# Patient Record
Sex: Female | Born: 1975 | Race: White | Hispanic: No | Marital: Married | Smoking: Never smoker
Health system: Southern US, Community
[De-identification: ages and names within clinical notes are randomized; demographics above are authoritative.]

## PROBLEM LIST (undated history)

## (undated) DIAGNOSIS — M25579 Pain in unspecified ankle and joints of unspecified foot: Secondary | ICD-10-CM

## (undated) DIAGNOSIS — I10 Essential (primary) hypertension: Secondary | ICD-10-CM

## (undated) DIAGNOSIS — F419 Anxiety disorder, unspecified: Secondary | ICD-10-CM

## (undated) HISTORY — PX: TONSILLECTOMY: SUR1361

## (undated) HISTORY — PX: ABLATION: SHX5711

---

## 2014-07-25 HISTORY — PX: ACHILLES TENDON REPAIR: SUR1153

## 2015-07-26 HISTORY — PX: ACHILLES TENDON REPAIR: SUR1153

## 2017-02-01 DIAGNOSIS — M7072 Other bursitis of hip, left hip: Secondary | ICD-10-CM | POA: Insufficient documentation

## 2018-03-30 ENCOUNTER — Ambulatory Visit (INDEPENDENT_AMBULATORY_CARE_PROVIDER_SITE_OTHER): Payer: Managed Care, Other (non HMO) | Admitting: Family Medicine

## 2018-03-30 ENCOUNTER — Encounter: Payer: Self-pay | Admitting: Family Medicine

## 2018-03-30 VITALS — BP 120/88 | HR 97 | Ht 66.75 in | Wt 237.0 lb

## 2018-03-30 DIAGNOSIS — M25571 Pain in right ankle and joints of right foot: Secondary | ICD-10-CM | POA: Diagnosis not present

## 2018-03-30 DIAGNOSIS — E669 Obesity, unspecified: Secondary | ICD-10-CM

## 2018-03-30 DIAGNOSIS — G8929 Other chronic pain: Secondary | ICD-10-CM

## 2018-03-30 DIAGNOSIS — Z800 Family history of malignant neoplasm of digestive organs: Secondary | ICD-10-CM | POA: Diagnosis not present

## 2018-03-30 DIAGNOSIS — Z7689 Persons encountering health services in other specified circumstances: Secondary | ICD-10-CM

## 2018-03-30 MED ORDER — TRAMADOL HCL 50 MG PO TABS: 100.0000 mg | ORAL_TABLET | Freq: Four times a day (QID) | ORAL | 0 refills | Status: DC | PRN

## 2018-03-30 MED ORDER — GABAPENTIN 300 MG PO CAPS: 300.0000 mg | ORAL_CAPSULE | Freq: Three times a day (TID) | ORAL | 1 refills | Status: DC

## 2018-03-30 NOTE — Progress Notes (Signed)
BP 120/88   Pulse 97   Ht 5' 6.75" (1.695 m)   Wt 237 lb (107.5 kg)   SpO2 98%   BMI 37.40 kg/m    Subjective:    Patient ID: Darlene [name], female    DOB: 03/16/1976, 42 y.o.   MRN: [id number]  HPI: Darlene [name] is a 42 y.o. female  Chief Complaint  Patient presents with  . New Patient (Initial Visit)    Patient would like Colonoscopy due to family history.    Patient here today to establish care.   Father had colon cancer diagnosed at 8 years old. Previous doctor told her she needed screening early. Denies any constipation, stool caliber changes, rectal bleeding, hx of anemia, weight loss, night sweats.   Tore achilles right foot several years ago, has had 2 surgeries with it and still in a lot of pain. Has had two orthopedists tell her there is nothing they can do from here. Has done PT, ice, elevation, rest, cortisone injections, OTC medications, percocet - nothing has really helped. Tramadol seems to work the best for her. Tried cymbalta which didn't help. Muscle relaxers make her hyper and unable to sleep. She only takes it on days where she's active (hiking, tennis, etc) just recently has been able to start being active again.   Last CPE was in November, last labs in March.   Relevant past medical, surgical, family and social history reviewed and updated as indicated. Interim medical history since our last visit reviewed. Allergies and medications reviewed and updated.  Review of Systems  Per HPI unless specifically indicated above     Objective:    BP 120/88   Pulse 97   Ht 5' 6.75" (1.695 m)   Wt 237 lb (107.5 kg)   SpO2 98%   BMI 37.40 kg/m   Wt Readings from Last 3 Encounters:  03/30/18 237 lb (107.5 kg)    Physical Exam  Constitutional: She is oriented to person, place, and time. She appears well-developed and well-nourished. No distress.  HENT:  Head: Atraumatic.  Eyes: Conjunctivae and EOM are normal.  Neck: Neck supple.  Cardiovascular:  Normal rate, regular rhythm and normal heart sounds.  Pulmonary/Chest: Effort normal and breath sounds normal.  Musculoskeletal: Normal range of motion. She exhibits edema (minimal edema posterior right ankle) and tenderness (right achilles).  Neurological: She is alert and oriented to person, place, and time.  Skin: Skin is warm and dry.  Psychiatric: She has a normal mood and affect. Her behavior is normal.  Nursing note and vitals reviewed.   No results found for this or any previous visit.    Assessment & Plan:   Problem List Items Addressed This Visit      Other   Chronic pain of right ankle - Primary    Long discussion today about management. Currently taking 1-2 tramadol weekly. Has tried many other interventions without success. Will start gabapentin and titrate up, monitoring closely for benefit. Will supply 40 tramadol. Pt aware that this should last at least 6 months, and if it is not that we will discuss going to the pain clinic at that point. She is agreeable to this plan. Urine drug screen today. Recheck in 2 months      Relevant Orders   Urine drugs of abuse scrn w alc, routine (Ref Lab)   Obesity (BMI 35.0-39.9 without comorbidity)    Work on increasing exercise as tolerated and improving diet       Other  Visit Diagnoses    Encounter to establish care       Family history of colon cancer       Discussed that typically colonoscopies are performed 10 years prior to family members' dx, so she can just start on a regular schedule at 48 unless sxs begin       Follow up plan: Return in about 2 years (around 03/30/2020) for CPE.

## 2018-03-30 NOTE — Assessment & Plan Note (Addendum)
Long discussion today about management. Currently taking 1-2 tramadol weekly. Has tried many other interventions without success. Will start gabapentin and titrate up, monitoring closely for benefit. Will supply 40 tramadol. Pt aware that this should last at least 6 months, and if it is not that we will discuss going to the pain clinic at that point. She is agreeable to this plan. Urine drug screen today. Recheck in 2 months

## 2018-03-30 NOTE — Assessment & Plan Note (Signed)
Work on increasing exercise as tolerated and improving diet

## 2018-03-30 NOTE — Patient Instructions (Signed)
Follow up for CPE 

## 2018-03-31 LAB — URINE DRUGS OF ABUSE SCREEN W ALC, ROUTINE (REF LAB)
Amphetamines, Urine: NEGATIVE ng/mL
BENZODIAZEPINE QUANT UR: NEGATIVE ng/mL
Barbiturate Quant, Ur: NEGATIVE ng/mL
CANNABINOID QUANT UR: NEGATIVE ng/mL
COCAINE (METAB.): NEGATIVE ng/mL
Ethanol, Urine: NEGATIVE %
METHADONE SCREEN, URINE: NEGATIVE ng/mL
OPIATE QUANT UR: NEGATIVE ng/mL
PCP QUANT UR: NEGATIVE ng/mL
Propoxyphene: NEGATIVE ng/mL

## 2018-05-31 ENCOUNTER — Ambulatory Visit (INDEPENDENT_AMBULATORY_CARE_PROVIDER_SITE_OTHER): Payer: Managed Care, Other (non HMO) | Admitting: Family Medicine

## 2018-05-31 VITALS — BP 127/85 | HR 82 | Temp 98.4°F | Ht 68.5 in | Wt 235.0 lb

## 2018-05-31 DIAGNOSIS — Z114 Encounter for screening for human immunodeficiency virus [HIV]: Secondary | ICD-10-CM

## 2018-05-31 DIAGNOSIS — Z23 Encounter for immunization: Secondary | ICD-10-CM

## 2018-05-31 DIAGNOSIS — E669 Obesity, unspecified: Secondary | ICD-10-CM | POA: Diagnosis not present

## 2018-05-31 DIAGNOSIS — G8929 Other chronic pain: Secondary | ICD-10-CM | POA: Diagnosis not present

## 2018-05-31 DIAGNOSIS — Z0000 Encounter for general adult medical examination without abnormal findings: Secondary | ICD-10-CM | POA: Diagnosis not present

## 2018-05-31 DIAGNOSIS — M25571 Pain in right ankle and joints of right foot: Secondary | ICD-10-CM

## 2018-05-31 MED ORDER — GABAPENTIN 300 MG PO CAPS: 300.0000 mg | ORAL_CAPSULE | Freq: Three times a day (TID) | ORAL | 1 refills | Status: DC

## 2018-05-31 NOTE — Patient Instructions (Signed)
Tdap Vaccine (Tetanus, Diphtheria and Pertussis): What You Need to Know 1. Why get vaccinated? Tetanus, diphtheria and pertussis are very serious diseases. Tdap vaccine can protect us from these diseases. And, Tdap vaccine given to pregnant women can protect newborn babies against pertussis. TETANUS (Lockjaw) is rare in the [place] today. It causes painful muscle tightening and stiffness, usually all over the body.  It can lead to tightening of muscles in the head and neck so you can't open your mouth, swallow, or sometimes even breathe. Tetanus kills about 1 out of 10 people who are infected even after receiving the best medical care.  DIPHTHERIA is also rare in the [place] today. It can cause a thick coating to form in the back of the throat.  It can lead to breathing problems, heart failure, paralysis, and death.  PERTUSSIS (Whooping Cough) causes severe coughing spells, which can cause difficulty breathing, vomiting and disturbed sleep.  It can also lead to weight loss, incontinence, and rib fractures. Up to 2 in 100 adolescents and 5 in 100 adults with pertussis are hospitalized or have complications, which could include pneumonia or death.  These diseases are caused by bacteria. Diphtheria and pertussis are spread from person to person through secretions from coughing or sneezing. Tetanus enters the body through cuts, scratches, or wounds. Before vaccines, as many as 200,000 cases of diphtheria, 200,000 cases of pertussis, and hundreds of cases of tetanus, were reported in the [place] each year. Since vaccination began, reports of cases for tetanus and diphtheria have dropped by about 99% and for pertussis by about 80%. 2. Tdap vaccine Tdap vaccine can protect adolescents and adults from tetanus, diphtheria, and pertussis. One dose of Tdap is routinely given at age 11 or 12. People who did not get Tdap at that age should get it as soon as possible. Tdap is especially  important for healthcare professionals and anyone having close contact with a baby younger than 12 months. Pregnant women should get a dose of Tdap during every pregnancy, to protect the newborn from pertussis. Infants are most at risk for severe, life-threatening complications from pertussis. Another vaccine, called Td, protects against tetanus and diphtheria, but not pertussis. A Td booster should be given every 10 years. Tdap may be given as one of these boosters if you have never gotten Tdap before. Tdap may also be given after a severe cut or burn to prevent tetanus infection. Your doctor or the person giving you the vaccine can give you more information. Tdap may safely be given at the same time as other vaccines. 3. Some people should not get this vaccine  A person who has ever had a life-threatening allergic reaction after a previous dose of any diphtheria, tetanus or pertussis containing vaccine, OR has a severe allergy to any part of this vaccine, should not get Tdap vaccine. Tell the person giving the vaccine about any severe allergies.  Anyone who had coma or long repeated seizures within 7 days after a childhood dose of DTP or DTaP, or a previous dose of Tdap, should not get Tdap, unless a cause other than the vaccine was found. They can still get Td.  Talk to your doctor if you: ? have seizures or another nervous system problem, ? had severe pain or swelling after any vaccine containing diphtheria, tetanus or pertussis, ? ever had a condition called Guillain-Barr Syndrome (GBS), ? aren't feeling well on the day the shot is scheduled. 4. Risks With any medicine, including   vaccines, there is a chance of side effects. These are usually mild and go away on their own. Serious reactions are also possible but are rare. Most people who get Tdap vaccine do not have any problems with it. Mild problems following Tdap: (Did not interfere with activities)  Pain where the shot was given (about  3 in 4 adolescents or 2 in 3 adults)  Redness or swelling where the shot was given (about 1 person in 5)  Mild fever of at least 100.4F (up to about 1 in 25 adolescents or 1 in 100 adults)  Headache (about 3 or 4 people in 10)  Tiredness (about 1 person in 3 or 4)  Nausea, vomiting, diarrhea, stomach ache (up to 1 in 4 adolescents or 1 in 10 adults)  Chills, sore joints (about 1 person in 10)  Body aches (about 1 person in 3 or 4)  Rash, swollen glands (uncommon)  Moderate problems following Tdap: (Interfered with activities, but did not require medical attention)  Pain where the shot was given (up to 1 in 5 or 6)  Redness or swelling where the shot was given (up to about 1 in 16 adolescents or 1 in 12 adults)  Fever over 102F (about 1 in 100 adolescents or 1 in 250 adults)  Headache (about 1 in 7 adolescents or 1 in 10 adults)  Nausea, vomiting, diarrhea, stomach ache (up to 1 or 3 people in 100)  Swelling of the entire arm where the shot was given (up to about 1 in 500).  Severe problems following Tdap: (Unable to perform usual activities; required medical attention)  Swelling, severe pain, bleeding and redness in the arm where the shot was given (rare).  Problems that could happen after any vaccine:  People sometimes faint after a medical procedure, including vaccination. Sitting or lying down for about 15 minutes can help prevent fainting, and injuries caused by a fall. Tell your doctor if you feel dizzy, or have vision changes or ringing in the ears.  Some people get severe pain in the shoulder and have difficulty moving the arm where a shot was given. This happens very rarely.  Any medication can cause a severe allergic reaction. Such reactions from a vaccine are very rare, estimated at fewer than 1 in a million doses, and would happen within a few minutes to a few hours after the vaccination. As with any medicine, there is a very remote chance of a vaccine  causing a serious injury or death. The safety of vaccines is always being monitored. For more information, visit: www.cdc.gov/vaccinesafety/ 5. What if there is a serious problem? What should I look for? Look for anything that concerns you, such as signs of a severe allergic reaction, very high fever, or unusual behavior. Signs of a severe allergic reaction can include hives, swelling of the face and throat, difficulty breathing, a fast heartbeat, dizziness, and weakness. These would usually start a few minutes to a few hours after the vaccination. What should I do?  If you think it is a severe allergic reaction or other emergency that can't wait, call 9-1-1 or get the person to the nearest hospital. Otherwise, call your doctor.  Afterward, the reaction should be reported to the Vaccine Adverse Event Reporting System (VAERS). Your doctor might file this report, or you can do it yourself through the VAERS web site at www.vaers.hhs.gov, or by calling 1-800-822-7967. ? VAERS does not give medical advice. 6. The National Vaccine Injury Compensation Program The National   Vaccine Injury Compensation Program (VICP) is a federal program that was created to compensate people who may have been injured by certain vaccines. Persons who believe they may have been injured by a vaccine can learn about the program and about filing a claim by calling 1-800-338-2382 or visiting the VICP website at www.hrsa.gov/vaccinecompensation. There is a time limit to file a claim for compensation. 7. How can I learn more?  Ask your doctor. He or she can give you the vaccine package insert or suggest other sources of information.  Call your local or state health department.  Contact the Centers for Disease Control and Prevention (CDC): ? Call 1-800-232-4636 (1-800-CDC-INFO) or ? Visit CDC's website at www.cdc.gov/vaccines CDC Tdap Vaccine VIS (09/17/13) This information is not intended to replace advice given to you by your  health care provider. Make sure you discuss any questions you have with your health care provider. Document Released: 01/10/2012 Document Revised: 03/31/2016 Document Reviewed: 03/31/2016 Elsevier Interactive Patient Education  2017 Elsevier Inc.  

## 2018-05-31 NOTE — Progress Notes (Signed)
BP 127/85   Pulse 82   Temp 98.4 F (36.9 C) (Oral)   Ht 5' 8.5" (1.74 m)   Wt 235 lb (106.6 kg)   SpO2 98%   BMI 35.21 kg/m    Subjective:    Patient ID: Darlene [name], female    DOB: October 27, 1975, 42 y.o.   MRN: [id number]  HPI: Darlene [name] is a 42 y.o. female presenting on 05/31/2018 for comprehensive medical examination. Current medical complaints include:see below  Has taken 2 tramadol since it was prescribed 2 months ago for her right ankle pain. Taking 2 gabapentin at bedtime prn which seems to help with sleep and maybe some with the pain.   Last pap she believes was around 1 year ago.   Last mammogram around age 68, normal result - pt unsure why she was made to do one at that time. Wanting to start her normal screening mammograms next year at age 44.   She currently lives with: Menopausal Symptoms: no  Depression Screen done today and results listed below:  Depression screen [facility] 2/9 05/31/2018 03/30/2018  Decreased Interest 0 0  Down, Depressed, Hopeless 0 0  PHQ - 2 Score 0 0  Altered sleeping 1 1  Tired, decreased energy 1 -  Change in appetite 0 0  Feeling bad or failure about yourself  0 0  Trouble concentrating 0 0  Moving slowly or fidgety/restless 0 0  Suicidal thoughts 0 0  PHQ-9 Score 2 1    The patient does not have a history of falls. I did not complete a risk assessment for falls. A plan of care for falls was not documented.   Past Medical History:  No past medical history on file.  Surgical History:  Past Surgical History:  Procedure Laterality Date  . ACHILLES TENDON REPAIR Right 2016  . ACHILLES TENDON REPAIR Right 2017    Medications:  Current Outpatient Medications on File Prior to Visit  Medication Sig  . etonogestrel (NEXPLANON) 68 MG IMPL implant 1 each by Subdermal route once.  . traMADol (ULTRAM) 50 MG tablet Take 2 tablets (100 mg total) by mouth every 6 (six) hours as needed.   No current facility-administered  medications on file prior to visit.     Allergies:  Allergies  Allergen Reactions  . Other     Social History:  Social History   Socioeconomic History  . Marital status: Married    Spouse name: Not on file  . Number of children: Not on file  . Years of education: Not on file  . Highest education level: Not on file  Occupational History  . Not on file  Social Needs  . Financial resource strain: Not on file  . Food insecurity:    Worry: Not on file    Inability: Not on file  . Transportation needs:    Medical: Not on file    Non-medical: Not on file  Tobacco Use  . Smoking status: Never Smoker  . Smokeless tobacco: Never Used  Substance and Sexual Activity  . Alcohol use: Never    Frequency: Never  . Drug use: Never  . Sexual activity: Yes    Partners: Male    Birth control/protection: IUD, Implant  Lifestyle  . Physical activity:    Days per week: Not on file    Minutes per session: Not on file  . Stress: Not on file  Relationships  . Social connections:    Talks on phone: Not on file  Gets together: Not on file    Attends religious service: Not on file    Active member of club or organization: Not on file    Attends meetings of clubs or organizations: Not on file    Relationship status: Not on file  . Intimate partner violence:    Fear of current or ex partner: Not on file    Emotionally abused: Not on file    Physically abused: Not on file    Forced sexual activity: Not on file  Other Topics Concern  . Not on file  Social History Narrative   Patient lives with husband. Recently relocated to [facility] from [place], and prior to that lived in [place]. Patient is from [place] and her family continues to reside there.    Social History   Tobacco Use  Smoking Status Never Smoker  Smokeless Tobacco Never Used   Social History   Substance and Sexual Activity  Alcohol Use Never  . Frequency: Never    Family History:  Family History  Problem Relation Age of  Onset  . Diabetes Mother   . Thyroid disease Mother   . Colon cancer Father   . Diabetes Father   . Heart disease Father   . Diabetes Maternal Grandmother     Past medical history, surgical history, medications, allergies, family history and social history reviewed with patient today and changes made to appropriate areas of the chart.   Review of Systems - General ROS: negative Psychological ROS: negative Ophthalmic ROS: negative ENT ROS: negative Allergy and Immunology ROS: negative Hematological and Lymphatic ROS: negative Endocrine ROS: negative Breast ROS: negative for breast lumps Respiratory ROS: no cough, shortness of breath, or wheezing Cardiovascular ROS: no chest pain or dyspnea on exertion Gastrointestinal ROS: no abdominal pain, change in bowel habits, or black or bloody stools Genito-Urinary ROS: no dysuria, trouble voiding, or hematuria Musculoskeletal ROS: negative Neurological ROS: no TIA or stroke symptoms Dermatological ROS: negative All other ROS negative except what is listed above and in the HPI.      Objective:    BP 127/85   Pulse 82   Temp 98.4 F (36.9 C) (Oral)   Ht 5' 8.5" (1.74 m)   Wt 235 lb (106.6 kg)   SpO2 98%   BMI 35.21 kg/m   Wt Readings from Last 3 Encounters:  05/31/18 235 lb (106.6 kg)  03/30/18 237 lb (107.5 kg)    Physical Exam  Constitutional: She is oriented to person, place, and time. She appears well-developed and well-nourished. No distress.  HENT:  Head: Atraumatic.  Right Ear: External ear normal.  Left Ear: External ear normal.  Nose: Nose normal.  Mouth/Throat: Oropharynx is clear and moist. No oropharyngeal exudate.  Eyes: Pupils are equal, round, and reactive to light. Conjunctivae are normal. No scleral icterus.  Neck: Normal range of motion. Neck supple. No thyromegaly present.  Cardiovascular: Normal rate, regular rhythm, normal heart sounds and intact distal pulses.  Pulmonary/Chest: Effort normal and breath  sounds normal. No respiratory distress. Right breast exhibits no mass, no skin change and no tenderness. Left breast exhibits no mass, no skin change and no tenderness.  Abdominal: Soft. Bowel sounds are normal. She exhibits no mass. There is no tenderness.  Musculoskeletal: Normal range of motion. She exhibits no edema or tenderness.  Lymphadenopathy:    She has no cervical adenopathy.    She has no axillary adenopathy.  Neurological: She is alert and oriented to person, place, and time. No cranial  nerve deficit.  Skin: Skin is warm and dry. No rash noted.  Psychiatric: She has a normal mood and affect. Her behavior is normal.  Nursing note and vitals reviewed.   Results for orders placed or performed in visit on 05/31/18  HIV antibody (with reflex)  Result Value Ref Range   HIV Screen 4th Generation wRfx Non Reactive Non Reactive  CBC with Differential/Platelet  Result Value Ref Range   WBC 7.6 3.4 - 10.8 x10E3/uL   RBC 4.82 3.77 - 5.28 x10E6/uL   Hemoglobin 13.8 11.1 - 15.9 g/dL   Hematocrit 19.1 47.8 - 46.6 %   MCV 87 79 - 97 fL   MCH 28.6 26.6 - 33.0 pg   MCHC 32.9 31.5 - 35.7 g/dL   RDW 29.5 (L) 62.1 - 30.8 %   Platelets 321 150 - 450 x10E3/uL   Neutrophils 64 Not Estab. %   Lymphs 26 Not Estab. %   Monocytes 6 Not Estab. %   Eos 3 Not Estab. %   Basos 1 Not Estab. %   Neutrophils Absolute 4.9 1.4 - 7.0 x10E3/uL   Lymphocytes Absolute 1.9 0.7 - 3.1 x10E3/uL   Monocytes Absolute 0.5 0.1 - 0.9 x10E3/uL   EOS (ABSOLUTE) 0.2 0.0 - 0.4 x10E3/uL   Basophils Absolute 0.1 0.0 - 0.2 x10E3/uL   Immature Granulocytes 0 Not Estab. %   Immature Grans (Abs) 0.0 0.0 - 0.1 x10E3/uL  Comprehensive metabolic panel  Result Value Ref Range   Glucose 94 65 - 99 mg/dL   BUN 12 6 - 24 mg/dL   Creatinine, Ser 6.57 0.57 - 1.00 mg/dL   GFR calc non Af Amer 93 >59 mL/min/1.73   GFR calc Af Amer 107 >59 mL/min/1.73   BUN/Creatinine Ratio 15 9 - 23   Sodium 142 134 - 144 mmol/L   Potassium  3.9 3.5 - 5.2 mmol/L   Chloride 100 96 - 106 mmol/L   CO2 23 20 - 29 mmol/L   Calcium 9.3 8.7 - 10.2 mg/dL   Total Protein 7.1 6.0 - 8.5 g/dL   Albumin 4.2 3.5 - 5.5 g/dL   Globulin, Total 2.9 1.5 - 4.5 g/dL   Albumin/Globulin Ratio 1.4 1.2 - 2.2   Bilirubin Total 0.5 0.0 - 1.2 mg/dL   Alkaline Phosphatase 20 (L) 39 - 117 IU/L   AST 17 0 - 40 IU/L   ALT 15 0 - 32 IU/L  Lipid Panel w/o Chol/HDL Ratio  Result Value Ref Range   Cholesterol, Total 189 100 - 199 mg/dL   Triglycerides 88 0 - 149 mg/dL   HDL 53 >84 mg/dL   VLDL Cholesterol Cal 18 5 - 40 mg/dL   LDL Calculated 696 (H) 0 - 99 mg/dL  TSH  Result Value Ref Range   TSH 1.330 0.450 - 4.500 uIU/mL      Assessment & Plan:   Problem List Items Addressed This Visit      Other   Chronic pain of right ankle - Primary    Will increase gabapentin to 3 daily as she's getting benefit from it for both sleep and pain. Reserve tramadol for very rare use for severe pain. Knows this script should last at least 6 months      Obesity (BMI 35.0-39.9 without comorbidity)    Discussed lifestyle modifications and how weight loss will improve her chronic ankle pain in addition to lowering health risks       Other Visit Diagnoses    Annual physical exam  Relevant Orders   CBC with Differential/Platelet (Completed)   Comprehensive metabolic panel (Completed)   Lipid Panel w/o Chol/HDL Ratio (Completed)   TSH (Completed)   UA/M w/rflx Culture, Routine   Encounter for screening for HIV       Relevant Orders   HIV antibody (with reflex) (Completed)   Need for Tdap vaccination       Relevant Orders   Tdap vaccine greater than or equal to 7yo IM (Completed)   Needs flu shot       Relevant Orders   Flu Vaccine QUAD 6+ mos PF IM (Fluarix Quad PF) (Completed)       Follow up plan: Return in about 1 year (around 06/01/2019) for CPE.   LABORATORY TESTING:  - Pap smear: up to date  IMMUNIZATIONS:   - Tdap: Tetanus vaccination  status reviewed: Td vaccination indicated and given today. - Influenza: Up to date  SCREENING: -Mammogram: refused, wanting to start these next year    PATIENT COUNSELING:   Advised to take 1 mg of folate supplement per day if capable of pregnancy.   Sexuality: Discussed sexually transmitted diseases, partner selection, use of condoms, avoidance of unintended pregnancy  and contraceptive alternatives.   Advised to avoid cigarette smoking.  I discussed with the patient that most people either abstain from alcohol or drink within safe limits (<=14/week and <=4 drinks/occasion for males, <=7/weeks and <= 3 drinks/occasion for females) and that the risk for alcohol disorders and other health effects rises proportionally with the number of drinks per week and how often a drinker exceeds daily limits.  Discussed cessation/primary prevention of drug use and availability of treatment for abuse.   Diet: Encouraged to adjust caloric intake to maintain  or achieve ideal body weight, to reduce intake of dietary saturated fat and total fat, to limit sodium intake by avoiding high sodium foods and not adding table salt, and to maintain adequate dietary potassium and calcium preferably from fresh fruits, vegetables, and low-fat dairy products.    stressed the importance of regular exercise  Injury prevention: Discussed safety belts, safety helmets, smoke detector, smoking near bedding or upholstery.   Dental health: Discussed importance of regular tooth brushing, flossing, and dental visits.    NEXT PREVENTATIVE PHYSICAL DUE IN 1 YEAR. Return in about 1 year (around 06/01/2019) for CPE.

## 2018-06-01 ENCOUNTER — Telehealth: Payer: Self-pay | Admitting: Family Medicine

## 2018-06-01 ENCOUNTER — Encounter: Payer: Self-pay | Admitting: Family Medicine

## 2018-06-01 LAB — CBC WITH DIFFERENTIAL/PLATELET
Basophils Absolute: 0.1 10*3/uL (ref 0.0–0.2)
Basos: 1 %
EOS (ABSOLUTE): 0.2 10*3/uL (ref 0.0–0.4)
EOS: 3 %
HEMATOCRIT: 42 % (ref 34.0–46.6)
Hemoglobin: 13.8 g/dL (ref 11.1–15.9)
Immature Grans (Abs): 0 10*3/uL (ref 0.0–0.1)
Immature Granulocytes: 0 %
LYMPHS ABS: 1.9 10*3/uL (ref 0.7–3.1)
Lymphs: 26 %
MCH: 28.6 pg (ref 26.6–33.0)
MCHC: 32.9 g/dL (ref 31.5–35.7)
MCV: 87 fL (ref 79–97)
MONOS ABS: 0.5 10*3/uL (ref 0.1–0.9)
Monocytes: 6 %
Neutrophils Absolute: 4.9 10*3/uL (ref 1.4–7.0)
Neutrophils: 64 %
PLATELETS: 321 10*3/uL (ref 150–450)
RBC: 4.82 x10E6/uL (ref 3.77–5.28)
RDW: 11.9 % — AB (ref 12.3–15.4)
WBC: 7.6 10*3/uL (ref 3.4–10.8)

## 2018-06-01 LAB — COMPREHENSIVE METABOLIC PANEL
A/G RATIO: 1.4 (ref 1.2–2.2)
ALK PHOS: 20 IU/L — AB (ref 39–117)
ALT: 15 IU/L (ref 0–32)
AST: 17 IU/L (ref 0–40)
Albumin: 4.2 g/dL (ref 3.5–5.5)
BUN/Creatinine Ratio: 15 (ref 9–23)
BUN: 12 mg/dL (ref 6–24)
Bilirubin Total: 0.5 mg/dL (ref 0.0–1.2)
CALCIUM: 9.3 mg/dL (ref 8.7–10.2)
CO2: 23 mmol/L (ref 20–29)
Chloride: 100 mmol/L (ref 96–106)
Creatinine, Ser: 0.78 mg/dL (ref 0.57–1.00)
GFR calc Af Amer: 107 mL/min/{1.73_m2} (ref >59)
GFR calc non Af Amer: 93 mL/min/{1.73_m2} (ref >59)
GLOBULIN, TOTAL: 2.9 g/dL (ref 1.5–4.5)
Glucose: 94 mg/dL (ref 65–99)
Potassium: 3.9 mmol/L (ref 3.5–5.2)
Sodium: 142 mmol/L (ref 134–144)
Total Protein: 7.1 g/dL (ref 6.0–8.5)

## 2018-06-01 LAB — LIPID PANEL W/O CHOL/HDL RATIO
Cholesterol, Total: 189 mg/dL (ref 100–199)
HDL: 53 mg/dL (ref >39)
LDL CALC: 118 mg/dL — AB (ref 0–99)
TRIGLYCERIDES: 88 mg/dL (ref 0–149)
VLDL Cholesterol Cal: 18 mg/dL (ref 5–40)

## 2018-06-01 LAB — HIV ANTIBODY (ROUTINE TESTING W REFLEX): HIV SCREEN 4TH GENERATION: NONREACTIVE

## 2018-06-01 LAB — TSH: TSH: 1.33 u[IU]/mL (ref 0.450–4.500)

## 2018-06-01 NOTE — Telephone Encounter (Signed)
LVM of cholesterol and HDL result. DPR Reviewed.

## 2018-06-01 NOTE — Telephone Encounter (Signed)
OK to relay results

## 2018-06-01 NOTE — Telephone Encounter (Signed)
Copied from CRM [id number]. Topic: Quick Communication - See Telephone Encounter >> Jun 01, 2018 11:42 AM [name] L wrote: CRM for notification. See Telephone encounter for: 06/01/18.  Patient would like to know what her " cholesterol reading was and her HDL for her online health assessment that she has to fill out. Patient said she is aware that it was faxed to her employer already but still needs this to fill out online. Please call patient

## 2018-06-10 NOTE — Assessment & Plan Note (Signed)
Will increase gabapentin to 3 daily as she's getting benefit from it for both sleep and pain. Reserve tramadol for very rare use for severe pain. Knows this script should last at least 6 months

## 2018-06-10 NOTE — Assessment & Plan Note (Signed)
Discussed lifestyle modifications and how weight loss will improve her chronic ankle pain in addition to lowering health risks

## 2018-10-05 ENCOUNTER — Other Ambulatory Visit: Payer: Self-pay | Admitting: Family Medicine

## 2018-10-05 NOTE — Telephone Encounter (Signed)
Requested medication (s) are due for refill today: yes  Requested medication (s) are on the active medication list: yes  Last refill:  03/30/18 #40  Future visit scheduled: No  Notes to clinic:  Medication not delegated to NT to refill   Requested Prescriptions  Pending Prescriptions Disp Refills   traMADol (ULTRAM) 50 MG tablet [Pharmacy Med Name: TRAMADOL HCL 50 MG TABLET] 40 tablet     Sig: Take 2 tablets (100 mg total) by mouth every 6 (six) hours as needed.     Not Delegated - Analgesics:  Opioid Agonists Failed - 10/05/2018  9:47 AM      Failed - This refill cannot be delegated      Failed - Urine Drug Screen completed in last 360 days.      Passed - Valid encounter within last 6 months    Recent Outpatient Visits          4 months ago Chronic pain of right ankle   [facility] [place], [place]   6 months ago Chronic pain of right ankle   [facility] [place], [place]

## 2018-12-04 ENCOUNTER — Other Ambulatory Visit: Payer: Self-pay | Admitting: Family Medicine

## 2019-04-05 ENCOUNTER — Other Ambulatory Visit: Payer: Self-pay | Admitting: Family Medicine

## 2019-04-05 NOTE — Telephone Encounter (Signed)
Last appointment 05/31/18 and told to follow up in a year per last OV note.

## 2019-04-05 NOTE — Telephone Encounter (Signed)
Requested medication (s) are due for refill today: yes  Requested medication (s) are on the active medication list: yes  Last refill: 10/05/2018  Future visit scheduled: no  Notes to clinic: review for refill   Requested Prescriptions  Pending Prescriptions Disp Refills   traMADol (ULTRAM) 50 MG tablet [Pharmacy Med Name: TRAMADOL HCL 50 MG TABLET] 40 tablet     Sig: TAKE 2 TABLETS (100 MG TOTAL) BY MOUTH EVERY 6 (SIX) HOURS AS NEEDED.     Not Delegated - Analgesics:  Opioid Agonists Failed - 04/05/2019 11:53 AM      Failed - This refill cannot be delegated      Failed - Urine Drug Screen completed in last 360 days.      Failed - Valid encounter within last 6 months    Recent Outpatient Visits          10 months ago Chronic pain of right ankle   [facility] [place], [place]   1 year ago Chronic pain of right ankle   [place], [place], [place]

## 2019-05-25 ENCOUNTER — Other Ambulatory Visit: Payer: Self-pay | Admitting: Family Medicine

## 2019-06-07 ENCOUNTER — Encounter: Payer: Self-pay | Admitting: Family Medicine

## 2019-06-07 ENCOUNTER — Other Ambulatory Visit: Payer: Self-pay

## 2019-06-07 ENCOUNTER — Ambulatory Visit (INDEPENDENT_AMBULATORY_CARE_PROVIDER_SITE_OTHER): Payer: Managed Care, Other (non HMO) | Admitting: Family Medicine

## 2019-06-07 VITALS — BP 129/83 | HR 70 | Temp 98.9°F | Ht 67.5 in | Wt 227.4 lb

## 2019-06-07 DIAGNOSIS — Z23 Encounter for immunization: Secondary | ICD-10-CM | POA: Diagnosis not present

## 2019-06-07 DIAGNOSIS — Z1231 Encounter for screening mammogram for malignant neoplasm of breast: Secondary | ICD-10-CM | POA: Diagnosis not present

## 2019-06-07 DIAGNOSIS — M25571 Pain in right ankle and joints of right foot: Secondary | ICD-10-CM

## 2019-06-07 DIAGNOSIS — G8929 Other chronic pain: Secondary | ICD-10-CM

## 2019-06-07 DIAGNOSIS — Z0000 Encounter for general adult medical examination without abnormal findings: Secondary | ICD-10-CM | POA: Diagnosis not present

## 2019-06-07 LAB — UA/M W/RFLX CULTURE, ROUTINE
Bilirubin, UA: NEGATIVE
Glucose, UA: NEGATIVE
Ketones, UA: NEGATIVE
Leukocytes,UA: NEGATIVE
Nitrite, UA: NEGATIVE
Protein,UA: NEGATIVE
Specific Gravity, UA: 1.025 (ref 1.005–1.030)
Urobilinogen, Ur: 0.2 mg/dL (ref 0.2–1.0)
pH, UA: 6 (ref 5.0–7.5)

## 2019-06-07 LAB — MICROSCOPIC EXAMINATION
Bacteria, UA: NONE SEEN
WBC, UA: NONE SEEN /hpf (ref 0–5)

## 2019-06-07 NOTE — Patient Instructions (Addendum)
[place] - call to schedule your mammogram at [phone]      Influenza (Flu) Vaccine (Inactivated or Recombinant): What You Need to Know 1. Why get vaccinated? Influenza vaccine can prevent influenza (flu). Flu is a contagious disease that spreads around the [place] every year, usually between October and May. Anyone can get the flu, but it is more dangerous for some people. Infants and young children, people 45 years of age and older, pregnant women, and people with certain health conditions or a weakened immune system are at greatest risk of flu complications. Pneumonia, bronchitis, sinus infections and ear infections are examples of flu-related complications. If you have a medical condition, such as heart disease, cancer or diabetes, flu can make it worse. Flu can cause fever and chills, sore throat, muscle aches, fatigue, cough, headache, and runny or stuffy nose. Some people may have vomiting and diarrhea, though this is more common in children than adults. Each year thousands of people in the [place] die from flu, and many more are hospitalized. Flu vaccine prevents millions of illnesses and flu-related visits to the doctor each year. 2. Influenza vaccine CDC recommends everyone 24 months of age and older get vaccinated every flu season. Children 6 months through 65 years of age may need 2 doses during a single flu season. Everyone else needs only 1 dose each flu season. It takes about 2 weeks for protection to develop after vaccination. There are many flu viruses, and they are always changing. Each year a new flu vaccine is made to protect against three or four viruses that are likely to cause disease in the upcoming flu season. Even when the vaccine doesn't exactly match these viruses, it may still provide some protection. Influenza vaccine does not cause flu. Influenza vaccine may be given at the same time as other vaccines. 3. Talk with your health care  provider Tell your vaccine provider if the person getting the vaccine:  Has had an allergic reaction after a previous dose of influenza vaccine, or has any severe, life-threatening allergies.  Has ever had Guillain-Barr Syndrome (also called GBS). In some cases, your health care provider may decide to postpone influenza vaccination to a future visit. People with minor illnesses, such as a cold, may be vaccinated. People who are moderately or severely ill should usually wait until they recover before getting influenza vaccine. Your health care provider can give you more information. 4. Risks of a vaccine reaction  Soreness, redness, and swelling where shot is given, fever, muscle aches, and headache can happen after influenza vaccine.  There may be a very small increased risk of Guillain-Barr Syndrome (GBS) after inactivated influenza vaccine (the flu shot). Young children who get the flu shot along with pneumococcal vaccine (PCV13), and/or DTaP vaccine at the same time might be slightly more likely to have a seizure caused by fever. Tell your health care provider if a child who is getting flu vaccine has ever had a seizure. People sometimes faint after medical procedures, including vaccination. Tell your provider if you feel dizzy or have vision changes or ringing in the ears. As with any medicine, there is a very remote chance of a vaccine causing a severe allergic reaction, other serious injury, or death. 5. What if there is a serious problem? An allergic reaction could occur after the vaccinated person leaves the clinic. If you see signs of a severe allergic reaction (hives, swelling of the face and throat, difficulty breathing, a fast heartbeat,  dizziness, or weakness), call 9-1-1 and get the person to the nearest hospital. For other signs that concern you, call your health care provider. Adverse reactions should be reported to the Vaccine Adverse Event Reporting System (VAERS). Your health  care provider will usually file this report, or you can do it yourself. Visit the VAERS website at www.vaers.SamedayNews.es or call 3863267609.VAERS is only for reporting reactions, and VAERS staff do not give medical advice. 6. The National Vaccine Injury Compensation Program The [employer] Vaccine Injury Compensation Program (VICP) is a federal program that was created to compensate people who may have been injured by certain vaccines. Visit the VICP website at GoldCloset.com.ee or call 985-761-5011 to learn about the program and about filing a claim. There is a time limit to file a claim for compensation. 7. How can I learn more?  Ask your healthcare provider.  Call your local or state health department.  Contact the Centers for Disease Control and Prevention (CDC): ? Call 337-647-8649 (1-800-CDC-INFO) or ? Visit CDC's https://gibson.com/ Vaccine Information Statement (Interim) Inactivated Influenza Vaccine (03/08/2018) This information is not intended to replace advice given to you by your health care provider. Make sure you discuss any questions you have with your health care provider. Document Released: 05/05/2006 Document Revised: 10/30/2018 Document Reviewed: 03/12/2018 Elsevier Patient Education  2020 [employer].

## 2019-06-07 NOTE — Progress Notes (Signed)
BP 129/83   Pulse 70   Temp 98.9 F (37.2 C) (Oral)   Ht 5' 7.5" (1.715 m)   Wt 227 lb 6.4 oz (103.1 kg)   LMP  (LMP Unknown)   SpO2 99%   BMI 35.09 kg/m    Subjective:    Patient ID: Darlene [name]    DOB: 28-Jun-1976, 43 y.o.   MRN: [id number]  HPI: Maryella Abood is a 43 y.o. female presenting on 06/07/2019 for comprehensive medical examination. Current medical complaints include:none  On gabapentin and prn tramadol for chronic right foot pain. Having to use the tramadol only for certain situations such as after long hikes on the weekends and such.   She currently lives with: Menopausal Symptoms: no  Depression Screen done today and results listed below:  Depression screen [facility] 2/9 06/07/2019 05/31/2018 03/30/2018  Decreased Interest 0 0 0  Down, Depressed, Hopeless 0 0 0  PHQ - 2 Score 0 0 0  Altered sleeping - 1 1  Tired, decreased energy - 1 -  Change in appetite - 0 0  Feeling bad or failure about yourself  - 0 0  Trouble concentrating - 0 0  Moving slowly or fidgety/restless - 0 0  Suicidal thoughts - 0 0  PHQ-9 Score - 2 1    The patient does not have a history of falls. I did complete a risk assessment for falls. A plan of care for falls was documented.   Past Medical History:  History reviewed. No pertinent past medical history.  Surgical History:  Past Surgical History:  Procedure Laterality Date  . ACHILLES TENDON REPAIR Right 2016  . ACHILLES TENDON REPAIR Right 2017    Medications:  Current Outpatient Medications on File Prior to Visit  Medication Sig  . etonogestrel (NEXPLANON) 68 MG IMPL implant 1 each by Subdermal route once.  . gabapentin (NEURONTIN) 300 MG capsule TAKE 1 CAPSULE BY MOUTH THREE TIMES A DAY  . traMADol (ULTRAM) 50 MG tablet TAKE 2 TABLETS (100 MG TOTAL) BY MOUTH EVERY 6 (SIX) HOURS AS NEEDED.   No current facility-administered medications on file prior to visit.     Allergies:  Allergies  Allergen Reactions   . Other     Social History:  Social History   Socioeconomic History  . Marital status: Married    Spouse name: Not on file  . Number of children: Not on file  . Years of education: Not on file  . Highest education level: Not on file  Occupational History  . Not on file  Social Needs  . Financial resource strain: Not on file  . Food insecurity    Worry: Not on file    Inability: Not on file  . Transportation needs    Medical: Not on file    Non-medical: Not on file  Tobacco Use  . Smoking status: Never Smoker  . Smokeless tobacco: Never Used  Substance and Sexual Activity  . Alcohol use: Never    Frequency: Never  . Drug use: Never  . Sexual activity: Yes    Partners: Male    Birth control/protection: I.U.D., Implant  Lifestyle  . Physical activity    Days per week: Not on file    Minutes per session: Not on file  . Stress: Not on file  Relationships  . Social Musician on phone: Not on file    Gets together: Not on file    Attends religious service: Not on file  Active member of club or organization: Not on file    Attends meetings of clubs or organizations: Not on file    Relationship status: Not on file  . Intimate partner violence    Fear of current or ex partner: Not on file    Emotionally abused: Not on file    Physically abused: Not on file    Forced sexual activity: Not on file  Other Topics Concern  . Not on file  Social History Narrative   Patient lives with husband. Recently relocated to [facility] from [place], and prior to that lived in [place]. Patient is from [place] and her family continues to reside there.    Social History   Tobacco Use  Smoking Status Never Smoker  Smokeless Tobacco Never Used   Social History   Substance and Sexual Activity  Alcohol Use Never  . Frequency: Never    Family History:  Family History  Problem Relation Age of Onset  . Diabetes Mother   . Thyroid disease Mother   . Colon cancer Father   . Diabetes  Father   . Heart disease Father   . Diabetes Maternal Grandmother     Past medical history, surgical history, medications, allergies, family history and social history reviewed with patient today and changes made to appropriate areas of the chart.   Review of Systems - General ROS: negative Psychological ROS: negative Ophthalmic ROS: negative ENT ROS: negative Allergy and Immunology ROS: negative Hematological and Lymphatic ROS: negative Endocrine ROS: negative Breast ROS: negative for breast lumps Respiratory ROS: no cough, shortness of breath, or wheezing Cardiovascular ROS: no chest pain or dyspnea on exertion Gastrointestinal ROS: no abdominal pain, change in bowel habits, or black or bloody stools Genito-Urinary ROS: no dysuria, trouble voiding, or hematuria Musculoskeletal ROS: negative Neurological ROS: no TIA or stroke symptoms Dermatological ROS: negative All other ROS negative except what is listed above and in the HPI.      Objective:    BP 129/83   Pulse 70   Temp 98.9 F (37.2 C) (Oral)   Ht 5' 7.5" (1.715 m)   Wt 227 lb 6.4 oz (103.1 kg)   LMP  (LMP Unknown)   SpO2 99%   BMI 35.09 kg/m   Wt Readings from Last 3 Encounters:  06/07/19 227 lb 6.4 oz (103.1 kg)  05/31/18 235 lb (106.6 kg)  03/30/18 237 lb (107.5 kg)    Physical Exam Vitals signs and nursing note reviewed.  Constitutional:      General: She is not in acute distress.    Appearance: She is well-developed.  HENT:     Head: Atraumatic.     Right Ear: External ear normal.     Left Ear: External ear normal.     Nose: Nose normal.     Mouth/Throat:     Pharynx: No oropharyngeal exudate.  Eyes:     General: No scleral icterus.    Conjunctiva/sclera: Conjunctivae normal.     Pupils: Pupils are equal, round, and reactive to light.  Neck:     Musculoskeletal: Normal range of motion and neck supple.     Thyroid: No thyromegaly.  Cardiovascular:     Rate and Rhythm: Normal rate and regular  rhythm.     Heart sounds: Normal heart sounds.  Pulmonary:     Effort: Pulmonary effort is normal. No respiratory distress.     Breath sounds: Normal breath sounds.  Chest:     Breasts:  Right: No nipple discharge, skin change or tenderness.        Left: No nipple discharge, skin change or tenderness.     Comments: Diffuse fibrodense breast tissue b/l Abdominal:     General: Bowel sounds are normal.     Palpations: Abdomen is soft. There is no mass.     Tenderness: There is no abdominal tenderness.  Genitourinary:    Comments: GU exam declined Musculoskeletal: Normal range of motion.        General: No tenderness.  Lymphadenopathy:     Cervical: No cervical adenopathy.     Upper Body:     Right upper body: No axillary adenopathy.     Left upper body: No axillary adenopathy.  Skin:    General: Skin is warm and dry.     Findings: No rash.  Neurological:     Mental Status: She is alert and oriented to person, place, and time.     Cranial Nerves: No cranial nerve deficit.  Psychiatric:        Behavior: Behavior normal.    Results for orders placed or performed in visit on 06/07/19  Microscopic Examination   URINE  Result Value Ref Range   WBC, UA None seen 0 - 5 /hpf   RBC 0-2 0 - 2 /hpf   Epithelial Cells (non renal) 0-10 0 - 10 /hpf   Mucus, UA Present Not Estab.   Bacteria, UA None seen None seen/Few  CBC with Differential/Platelet out  Result Value Ref Range   WBC 8.6 3.4 - 10.8 x10E3/uL   RBC 4.89 3.77 - 5.28 x10E6/uL   Hemoglobin 14.2 11.1 - 15.9 g/dL   Hematocrit 40.942.2 81.134.0 - 46.6 %   MCV 86 79 - 97 fL   MCH 29.0 26.6 - 33.0 pg   MCHC 33.6 31.5 - 35.7 g/dL   RDW 91.412.0 78.211.7 - 95.615.4 %   Platelets 331 150 - 450 x10E3/uL   Neutrophils 68 Not Estab. %   Lymphs 23 Not Estab. %   Monocytes 6 Not Estab. %   Eos 2 Not Estab. %   Basos 1 Not Estab. %   Neutrophils Absolute 5.9 1.4 - 7.0 x10E3/uL   Lymphocytes Absolute 2.0 0.7 - 3.1 x10E3/uL   Monocytes Absolute  0.5 0.1 - 0.9 x10E3/uL   EOS (ABSOLUTE) 0.2 0.0 - 0.4 x10E3/uL   Basophils Absolute 0.1 0.0 - 0.2 x10E3/uL   Immature Granulocytes 0 Not Estab. %   Immature Grans (Abs) 0.0 0.0 - 0.1 x10E3/uL  Comprehensive metabolic panel  Result Value Ref Range   Glucose 83 65 - 99 mg/dL   BUN 11 6 - 24 mg/dL   Creatinine, Ser 2.130.73 0.57 - 1.00 mg/dL   GFR calc non Af Amer 100 >59 mL/min/1.73   GFR calc Af Amer 115 >59 mL/min/1.73   BUN/Creatinine Ratio 15 9 - 23   Sodium 140 134 - 144 mmol/L   Potassium 4.0 3.5 - 5.2 mmol/L   Chloride 102 96 - 106 mmol/L   CO2 24 20 - 29 mmol/L   Calcium 9.3 8.7 - 10.2 mg/dL   Total Protein 7.4 6.0 - 8.5 g/dL   Albumin 4.5 3.8 - 4.8 g/dL   Globulin, Total 2.9 1.5 - 4.5 g/dL   Albumin/Globulin Ratio 1.6 1.2 - 2.2   Bilirubin Total 0.4 0.0 - 1.2 mg/dL   Alkaline Phosphatase 21 (L) 39 - 117 IU/L   AST 19 0 - 40 IU/L   ALT 15 0 -  32 IU/L  Lipid Panel w/o Chol/HDL Ratio out  Result Value Ref Range   Cholesterol, Total 186 100 - 199 mg/dL   Triglycerides 91 0 - 149 mg/dL   HDL 57 >39 mg/dL   VLDL Cholesterol Cal 17 5 - 40 mg/dL   LDL Chol Calc (NIH) 112 (H) 0 - 99 mg/dL  TSH  Result Value Ref Range   TSH 0.972 0.450 - 4.500 uIU/mL  UA/M w/rflx Culture, Routine   Specimen: Urine   URINE  Result Value Ref Range   Specific Gravity, UA 1.025 1.005 - 1.030   pH, UA 6.0 5.0 - 7.5   Color, UA Yellow Yellow   Appearance Ur Clear Clear   Leukocytes,UA Negative Negative   Protein,UA Negative Negative/Trace   Glucose, UA Negative Negative   Ketones, UA Negative Negative   RBC, UA Trace (A) Negative   Bilirubin, UA Negative Negative   Urobilinogen, Ur 0.2 0.2 - 1.0 mg/dL   Nitrite, UA Negative Negative   Microscopic Examination See below:       Assessment & Plan:   Problem List Items Addressed This Visit      Other   Chronic pain of right ankle - Primary    Stable and under good control, continue current regimen with rare prn use of tramadol for flares        Other Visit Diagnoses    Annual physical exam       Relevant Orders   CBC with Differential/Platelet out (Completed)   Comprehensive metabolic panel (Completed)   Lipid Panel w/o Chol/HDL Ratio out (Completed)   TSH (Completed)   UA/M w/rflx Culture, Routine (Completed)   Need for influenza vaccination       Relevant Orders   Flu Vaccine QUAD 36+ mos PF IM (Fluarix & Fluzone Quad PF)   Encounter for screening mammogram for malignant neoplasm of breast       Relevant Orders   MM DIGITAL SCREENING BILATERAL       Follow up plan: Return in about 1 year (around 06/06/2020) for CPE.   LABORATORY TESTING:  - Pap smear: up to date  IMMUNIZATIONS:   - Tdap: Tetanus vaccination status reviewed: last tetanus booster within 10 years. - Influenza: Administered today  SCREENING: -Mammogram: Ordered today   PATIENT COUNSELING:   Advised to take 1 mg of folate supplement per day if capable of pregnancy.   Sexuality: Discussed sexually transmitted diseases, partner selection, use of condoms, avoidance of unintended pregnancy  and contraceptive alternatives.   Advised to avoid cigarette smoking.  I discussed with the patient that most people either abstain from alcohol or drink within safe limits (<=14/week and <=4 drinks/occasion for males, <=7/weeks and <= 3 drinks/occasion for females) and that the risk for alcohol disorders and other health effects rises proportionally with the number of drinks per week and how often a drinker exceeds daily limits.  Discussed cessation/primary prevention of drug use and availability of treatment for abuse.   Diet: Encouraged to adjust caloric intake to maintain  or achieve ideal body weight, to reduce intake of dietary saturated fat and total fat, to limit sodium intake by avoiding high sodium foods and not adding table salt, and to maintain adequate dietary potassium and calcium preferably from fresh fruits, vegetables, and low-fat dairy  products.    stressed the importance of regular exercise  Injury prevention: Discussed safety belts, safety helmets, smoke detector, smoking near bedding or upholstery.   Dental health: Discussed importance of regular  tooth brushing, flossing, and dental visits.    NEXT PREVENTATIVE PHYSICAL DUE IN 1 YEAR. Return in about 1 year (around 06/06/2020) for CPE.

## 2019-06-07 NOTE — Assessment & Plan Note (Signed)
Stable and under good control, continue current regimen with rare prn use of tramadol for flares

## 2019-06-08 LAB — COMPREHENSIVE METABOLIC PANEL
ALT: 15 IU/L (ref 0–32)
AST: 19 IU/L (ref 0–40)
Albumin/Globulin Ratio: 1.6 (ref 1.2–2.2)
Albumin: 4.5 g/dL (ref 3.8–4.8)
Alkaline Phosphatase: 21 IU/L — ABNORMAL LOW (ref 39–117)
BUN/Creatinine Ratio: 15 (ref 9–23)
BUN: 11 mg/dL (ref 6–24)
Bilirubin Total: 0.4 mg/dL (ref 0.0–1.2)
CO2: 24 mmol/L (ref 20–29)
Calcium: 9.3 mg/dL (ref 8.7–10.2)
Chloride: 102 mmol/L (ref 96–106)
Creatinine, Ser: 0.73 mg/dL (ref 0.57–1.00)
GFR calc Af Amer: 115 mL/min/{1.73_m2} (ref >59)
GFR calc non Af Amer: 100 mL/min/{1.73_m2} (ref >59)
Globulin, Total: 2.9 g/dL (ref 1.5–4.5)
Glucose: 83 mg/dL (ref 65–99)
Potassium: 4 mmol/L (ref 3.5–5.2)
Sodium: 140 mmol/L (ref 134–144)
Total Protein: 7.4 g/dL (ref 6.0–8.5)

## 2019-06-08 LAB — CBC WITH DIFFERENTIAL/PLATELET
Basophils Absolute: 0.1 10*3/uL (ref 0.0–0.2)
Basos: 1 %
EOS (ABSOLUTE): 0.2 10*3/uL (ref 0.0–0.4)
Eos: 2 %
Hematocrit: 42.2 % (ref 34.0–46.6)
Hemoglobin: 14.2 g/dL (ref 11.1–15.9)
Immature Grans (Abs): 0 10*3/uL (ref 0.0–0.1)
Immature Granulocytes: 0 %
Lymphocytes Absolute: 2 10*3/uL (ref 0.7–3.1)
Lymphs: 23 %
MCH: 29 pg (ref 26.6–33.0)
MCHC: 33.6 g/dL (ref 31.5–35.7)
MCV: 86 fL (ref 79–97)
Monocytes Absolute: 0.5 10*3/uL (ref 0.1–0.9)
Monocytes: 6 %
Neutrophils Absolute: 5.9 10*3/uL (ref 1.4–7.0)
Neutrophils: 68 %
Platelets: 331 10*3/uL (ref 150–450)
RBC: 4.89 x10E6/uL (ref 3.77–5.28)
RDW: 12 % (ref 11.7–15.4)
WBC: 8.6 10*3/uL (ref 3.4–10.8)

## 2019-06-08 LAB — LIPID PANEL W/O CHOL/HDL RATIO
Cholesterol, Total: 186 mg/dL (ref 100–199)
HDL: 57 mg/dL (ref >39)
LDL Chol Calc (NIH): 112 mg/dL — ABNORMAL HIGH (ref 0–99)
Triglycerides: 91 mg/dL (ref 0–149)
VLDL Cholesterol Cal: 17 mg/dL (ref 5–40)

## 2019-06-08 LAB — TSH: TSH: 0.972 u[IU]/mL (ref 0.450–4.500)

## 2019-08-19 ENCOUNTER — Other Ambulatory Visit: Payer: Self-pay | Admitting: Family Medicine

## 2019-08-19 NOTE — Telephone Encounter (Signed)
Requested medication (s) are due for refill today- yes  Requested medication (s) are on the active medication list -yes  Future visit scheduled -no  Last refill: 04/05/19  Notes to clinic: Patient is requesting refill of non delegated Rx.  Requested Prescriptions  Pending Prescriptions Disp Refills   traMADol (ULTRAM) 50 MG tablet [Pharmacy Med Name: TRAMADOL HCL 50 MG TABLET] 40 tablet 0    Sig: TAKE 2 TABLETS BY MOUTH EVERY 6 HOURS AS NEEDED      Not Delegated - Analgesics:  Opioid Agonists Failed - 08/19/2019  3:51 PM      Failed - This refill cannot be delegated      Failed - Urine Drug Screen completed in last 360 days.      Failed - Valid encounter within last 6 months    Recent Outpatient Visits           2 months ago Chronic pain of right ankle   [facility] [place], [place]   1 year ago Chronic pain of right ankle   [facility] [place], [place]   1 year ago Chronic pain of right ankle   [facility] [place], [place]                  Requested Prescriptions  Pending Prescriptions Disp Refills   traMADol (ULTRAM) 50 MG tablet [Pharmacy Med Name: TRAMADOL HCL 50 MG TABLET] 40 tablet 0    Sig: TAKE 2 TABLETS BY MOUTH EVERY 6 HOURS AS NEEDED      Not Delegated - Analgesics:  Opioid Agonists Failed - 08/19/2019  3:51 PM      Failed - This refill cannot be delegated      Failed - Urine Drug Screen completed in last 360 days.      Failed - Valid encounter within last 6 months    Recent Outpatient Visits           2 months ago Chronic pain of right ankle   [facility] [place], [place]   1 year ago Chronic pain of right ankle   [facility] [place], [place]   1 year ago Chronic pain of right ankle   [facility] [place], [place]

## 2019-08-20 NOTE — Telephone Encounter (Signed)
Patient last seen 06/07/19

## 2019-08-20 NOTE — Telephone Encounter (Signed)
Will need to wait for Rachel's return.  

## 2019-10-16 ENCOUNTER — Other Ambulatory Visit: Payer: Self-pay | Admitting: Family Medicine

## 2019-10-16 DIAGNOSIS — Z1231 Encounter for screening mammogram for malignant neoplasm of breast: Secondary | ICD-10-CM

## 2019-10-17 ENCOUNTER — Ambulatory Visit (INDEPENDENT_AMBULATORY_CARE_PROVIDER_SITE_OTHER): Payer: Managed Care, Other (non HMO) | Admitting: Family Medicine

## 2019-10-17 ENCOUNTER — Encounter: Payer: Self-pay | Admitting: Family Medicine

## 2019-10-17 ENCOUNTER — Other Ambulatory Visit: Payer: Self-pay

## 2019-10-17 VITALS — BP 143/83 | HR 72 | Temp 98.5°F | Ht 67.5 in | Wt 124.0 lb

## 2019-10-17 DIAGNOSIS — R21 Rash and other nonspecific skin eruption: Secondary | ICD-10-CM | POA: Diagnosis not present

## 2019-10-17 MED ORDER — CLOBETASOL PROPIONATE 0.05 % EX OINT: 1.0000 "application " | TOPICAL_OINTMENT | Freq: Two times a day (BID) | CUTANEOUS | 0 refills | Status: DC

## 2019-10-17 NOTE — Progress Notes (Signed)
BP (!) 143/83   Pulse 72   Temp 98.5 F (36.9 C) (Oral)   Ht 5' 7.5" (1.715 m)   Wt 124 lb (56.2 kg)   SpO2 99%   BMI 19.13 kg/m    Subjective:    Patient ID: Darlene [name], female    DOB: [birth year], 44 y.o.   MRN: [id number]  HPI: Darlene [name] is a 44 y.o. female  Chief Complaint  Patient presents with  . Rash    around waist x over a week   Was chopping and stacking fire wood last week and then soon after noticed some spots coming up and spreading on her waist where she was frequently grabbing her pants to pull them up while working. Very itchy, not painful. Had some cortisone cream which seemed to dry the area up some. Denies fever, chills, sweats, burning pain.   Relevant past medical, surgical, family and social history reviewed and updated as indicated. Interim medical history since our last visit reviewed. Allergies and medications reviewed and updated.  Review of Systems  Per HPI unless specifically indicated above     Objective:    BP (!) 143/83   Pulse 72   Temp 98.5 F (36.9 C) (Oral)   Ht 5' 7.5" (1.715 m)   Wt 124 lb (56.2 kg)   SpO2 99%   BMI 19.13 kg/m   Wt Readings from Last 3 Encounters:  10/17/19 124 lb (56.2 kg)  06/07/19 227 lb 6.4 oz (103.1 kg)  05/31/18 235 lb (106.6 kg)    Physical Exam Vitals and nursing note reviewed.  Constitutional:      Appearance: Normal appearance. She is not ill-appearing.  HENT:     Head: Atraumatic.  Eyes:     Extraocular Movements: Extraocular movements intact.     Conjunctiva/sclera: Conjunctivae normal.  Cardiovascular:     Rate and Rhythm: Normal rate and regular rhythm.     Heart sounds: Normal heart sounds.  Pulmonary:     Effort: Pulmonary effort is normal.     Breath sounds: Normal breath sounds.  Musculoskeletal:        General: Normal range of motion.     Cervical back: Normal range of motion and neck supple.  Skin:    General: Skin is warm and dry.     Findings: Rash  (erythematous maculopapular rash with some blistering in patches around waistline) present.  Neurological:     Mental Status: She is alert and oriented to person, place, and time.  Psychiatric:        Mood and Affect: Mood normal.        Thought Content: Thought content normal.        Judgment: Judgment normal.     Results for orders placed or performed in visit on 06/07/19  Microscopic Examination   URINE  Result Value Ref Range   WBC, UA None seen 0 - 5 /hpf   RBC 0-2 0 - 2 /hpf   Epithelial Cells (non renal) 0-10 0 - 10 /hpf   Mucus, UA Present Not Estab.   Bacteria, UA None seen None seen/Few  CBC with Differential/Platelet out  Result Value Ref Range   WBC 8.6 3.4 - 10.8 x10E3/uL   RBC 4.89 3.77 - 5.28 x10E6/uL   Hemoglobin 14.2 11.1 - 15.9 g/dL   Hematocrit 15.4 00.8 - 46.6 %   MCV 86 79 - 97 fL   MCH 29.0 26.6 - 33.0 pg   MCHC 33.6 31.5 - 35.7  g/dL   RDW 12.0 11.7 - 15.4 %   Platelets 331 150 - 450 x10E3/uL   Neutrophils 68 Not Estab. %   Lymphs 23 Not Estab. %   Monocytes 6 Not Estab. %   Eos 2 Not Estab. %   Basos 1 Not Estab. %   Neutrophils Absolute 5.9 1.4 - 7.0 x10E3/uL   Lymphocytes Absolute 2.0 0.7 - 3.1 x10E3/uL   Monocytes Absolute 0.5 0.1 - 0.9 x10E3/uL   EOS (ABSOLUTE) 0.2 0.0 - 0.4 x10E3/uL   Basophils Absolute 0.1 0.0 - 0.2 x10E3/uL   Immature Granulocytes 0 Not Estab. %   Immature Grans (Abs) 0.0 0.0 - 0.1 x10E3/uL  Comprehensive metabolic panel  Result Value Ref Range   Glucose 83 65 - 99 mg/dL   BUN 11 6 - 24 mg/dL   Creatinine, Ser 0.73 0.57 - 1.00 mg/dL   GFR calc non Af Amer 100 >59 mL/min/1.73   GFR calc Af Amer 115 >59 mL/min/1.73   BUN/Creatinine Ratio 15 9 - 23   Sodium 140 134 - 144 mmol/L   Potassium 4.0 3.5 - 5.2 mmol/L   Chloride 102 96 - 106 mmol/L   CO2 24 20 - 29 mmol/L   Calcium 9.3 8.7 - 10.2 mg/dL   Total Protein 7.4 6.0 - 8.5 g/dL   Albumin 4.5 3.8 - 4.8 g/dL   Globulin, Total 2.9 1.5 - 4.5 g/dL   Albumin/Globulin  Ratio 1.6 1.2 - 2.2   Bilirubin Total 0.4 0.0 - 1.2 mg/dL   Alkaline Phosphatase 21 (L) 39 - 117 IU/L   AST 19 0 - 40 IU/L   ALT 15 0 - 32 IU/L  Lipid Panel w/o Chol/HDL Ratio out  Result Value Ref Range   Cholesterol, Total 186 100 - 199 mg/dL   Triglycerides 91 0 - 149 mg/dL   HDL 57 >39 mg/dL   VLDL Cholesterol Cal 17 5 - 40 mg/dL   LDL Chol Calc (NIH) 112 (H) 0 - 99 mg/dL  TSH  Result Value Ref Range   TSH 0.972 0.450 - 4.500 uIU/mL  UA/M w/rflx Culture, Routine   Specimen: Urine   URINE  Result Value Ref Range   Specific Gravity, UA 1.025 1.005 - 1.030   pH, UA 6.0 5.0 - 7.5   Color, UA Yellow Yellow   Appearance Ur Clear Clear   Leukocytes,UA Negative Negative   Protein,UA Negative Negative/Trace   Glucose, UA Negative Negative   Ketones, UA Negative Negative   RBC, UA Trace (A) Negative   Bilirubin, UA Negative Negative   Urobilinogen, Ur 0.2 0.2 - 1.0 mg/dL   Nitrite, UA Negative Negative   Microscopic Examination See below:       Assessment & Plan:   Problem List Items Addressed This Visit    None    Visit Diagnoses    Rash    -  Primary   Consistent with poison ivy. Tx wtih clobetasol ointment, antihistamines. F/u if worsening or not improving       Follow up plan: Return if symptoms worsen or fail to improve.

## 2019-10-18 ENCOUNTER — Ambulatory Visit: Payer: Managed Care, Other (non HMO) | Attending: Internal Medicine

## 2019-10-18 DIAGNOSIS — Z23 Encounter for immunization: Secondary | ICD-10-CM

## 2019-10-18 NOTE — Progress Notes (Signed)
   Covid-19 Vaccination Clinic  Name:  Darlene [name]    MRN: [id number] DOB: July 28, 1975  10/18/2019  Darlene [name] was observed post Covid-19 immunization for 15 minutes without incident. She was provided with Vaccine Information Sheet and instruction to access the V-Safe system.   Darlene [name] was instructed to call 911 with any severe reactions post vaccine: Marland Kitchen Difficulty breathing  . Swelling of face and throat  . A fast heartbeat  . A bad rash all over body  . Dizziness and weakness   Immunizations Administered    Name Date Dose VIS Date Route   Moderna COVID-19 Vaccine 10/18/2019 10:51 AM 0.5 mL 06/25/2019 Intramuscular   Manufacturer: Moderna   Lot: [id number]   NDC: 62836-629-47

## 2019-10-21 ENCOUNTER — Inpatient Hospital Stay: Admission: RE | Admit: 2019-10-21 | Payer: Managed Care, Other (non HMO) | Source: Ambulatory Visit

## 2019-10-21 ENCOUNTER — Other Ambulatory Visit: Payer: Self-pay | Admitting: Family Medicine

## 2019-10-21 NOTE — Telephone Encounter (Signed)
Routing to provider  

## 2019-10-21 NOTE — Telephone Encounter (Signed)
Requested medication (s) are due for refill today: yes  Requested medication (s) are on the active medication list: yes  Last refill: 04/05/2019   #40  0 refills  Future visit scheduled   no  Notes to clinic:Not delegated  Requested Prescriptions  Pending Prescriptions Disp Refills   traMADol (ULTRAM) 50 MG tablet [Pharmacy Med Name: TRAMADOL HCL 50 MG TABLET] 40 tablet     Sig: TAKE 2 TABLETS BY MOUTH EVERY 6 HOURS AS NEEDED      Not Delegated - Analgesics:  Opioid Agonists Failed - 10/21/2019  2:54 PM      Failed - This refill cannot be delegated      Failed - Urine Drug Screen completed in last 360 days.      Passed - Valid encounter within last 6 months    Recent Outpatient Visits           4 days ago    [facility], [place], [place]   4 months ago Chronic pain of right ankle   [facility] Roosvelt Maser [place], [place]   1 year ago Chronic pain of right ankle   [facility] Roosvelt Maser [place], [place]   1 year ago Chronic pain of right ankle   [facility] Roosvelt Maser [place], [place]

## 2019-11-14 ENCOUNTER — Other Ambulatory Visit: Payer: Self-pay | Admitting: Family Medicine

## 2019-11-19 ENCOUNTER — Ambulatory Visit: Payer: Managed Care, Other (non HMO) | Attending: Internal Medicine

## 2019-11-19 DIAGNOSIS — Z23 Encounter for immunization: Secondary | ICD-10-CM

## 2019-11-19 NOTE — Progress Notes (Signed)
   Covid-19 Vaccination Clinic  Name:  Darlene [name]    MRN: [id number] DOB: September 28, 1975  11/19/2019  [name] was observed post Covid-19 immunization for 15 minutes without incident. She was provided with Vaccine Information Sheet and instruction to access the V-Safe system.   [name] was instructed to call 911 with any severe reactions post vaccine: Marland Kitchen Difficulty breathing  . Swelling of face and throat  . A fast heartbeat  . A bad rash all over body  . Dizziness and weakness   Immunizations Administered    Name Date Dose VIS Date Route   Moderna COVID-19 Vaccine 11/19/2019 10:41 AM 0.5 mL 06/2019 Intramuscular   Manufacturer: Moderna   Lot: [id number]   NDC: 63846-659-93

## 2020-01-07 ENCOUNTER — Ambulatory Visit (INDEPENDENT_AMBULATORY_CARE_PROVIDER_SITE_OTHER): Payer: Self-pay | Admitting: Nurse Practitioner

## 2020-01-07 ENCOUNTER — Encounter: Payer: Self-pay | Admitting: Nurse Practitioner

## 2020-01-07 ENCOUNTER — Other Ambulatory Visit: Payer: Self-pay

## 2020-01-07 ENCOUNTER — Inpatient Hospital Stay: Admission: RE | Admit: 2020-01-07 | Payer: Managed Care, Other (non HMO) | Source: Ambulatory Visit

## 2020-01-07 VITALS — BP 144/81 | HR 76 | Temp 98.8°F

## 2020-01-07 DIAGNOSIS — L237 Allergic contact dermatitis due to plants, except food: Secondary | ICD-10-CM

## 2020-01-07 MED ORDER — PREDNISONE 10 MG PO TABS: ORAL_TABLET | ORAL | 0 refills | Status: DC

## 2020-01-07 MED ORDER — CLOBETASOL PROPIONATE 0.05 % EX OINT: 1.0000 "application " | TOPICAL_OINTMENT | Freq: Two times a day (BID) | CUTANEOUS | 0 refills | Status: DC

## 2020-01-07 NOTE — Patient Instructions (Signed)
Poison Ivy Dermatitis Poison ivy dermatitis is redness and soreness of the skin caused by chemicals in the leaves of the poison ivy plant. You may have very bad itching, swelling, a rash, and blisters. What are the causes?  Touching a poison ivy plant.  Touching something that has the chemical on it. This may include animals or objects that have come in contact with the plant. What increases the risk?  Going outdoors often in wooded or marshy areas.  Going outdoors without wearing protective clothing, such as closed shoes, long pants, and a long-sleeved shirt. What are the signs or symptoms?   Skin redness.  Very bad itching.  A rash that often includes bumps and blisters. ? The rash usually appears 48 hours after exposure, if you have been exposed before. ? If this is the first time you have been exposed, the rash may not appear until a week after exposure.  Swelling. This may occur if the reaction is very bad. Symptoms usually last for 1-2 weeks. The first time you develop this condition, symptoms may last 3-4 weeks. How is this treated? This condition may be treated with:  Hydrocortisone cream or calamine lotion to relieve itching.  Oatmeal baths to soothe the skin.  Medicines, such as over-the-counter antihistamine tablets.  Oral steroid medicine for more severe reactions. Follow these instructions at home: Medicines  Take or apply over-the-counter and prescription medicines only as told by your doctor.  Use hydrocortisone cream or calamine lotion as needed to help with itching. General instructions  Do not scratch or rub your skin.  Put a cold, wet cloth (cold compress) on the affected areas or take baths in cool water. This will help with itching.  Avoid hot baths and showers.  Take oatmeal baths as needed. Use colloidal oatmeal. You can get this at a pharmacy or grocery store. Follow the instructions on the package.  While you have the rash, wash your clothes  right after you wear them.  Keep all follow-up visits as told by your health care provider. This is important. How is this prevented?   Know what poison ivy looks like, so you can avoid it. ? This plant has three leaves with flowering branches on a single stem. ? The leaves are glossy. ? The leaves have uneven edges that come to a point at the front.  If you touch poison ivy, wash your skin with soap and water right away. Be sure to wash under your fingernails.  When hiking or camping, wear long pants, a long-sleeved shirt, tall socks, and hiking boots. You can also use a lotion on your skin that helps to prevent contact with poison ivy.  If you think that your clothes or outdoor gear came in contact with poison ivy, rinse them off with a garden hose before you bring them inside your house.  When doing yard work or gardening, wear gloves, long sleeves, long pants, and boots. Wash your garden tools and gloves if they come in contact with poison ivy.  If you think that your pet has come into contact with poison ivy, wash him or her with pet shampoo and water. Make sure to wear gloves while washing your pet. Contact a doctor if:  You have open sores in the rash area.  You have more redness, swelling, or pain in the rash area.  You have redness that spreads beyond the rash area.  You have fluid, blood, or pus coming from the rash area.  You have a   fever.  You have a rash over a large area of your body.  You have a rash on your eyes, mouth, or genitals.  Your rash does not get better after a few weeks. Get help right away if:  Your face swells or your eyes swell shut.  You have trouble breathing.  You have trouble swallowing. These symptoms may be an emergency. Do not wait to see if the symptoms will go away. Get medical help right away. Call your local emergency services (911 in the [place] Do not drive yourself to the hospital. Summary  Poison ivy dermatitis is redness and  soreness of the skin caused by chemicals in the leaves of the poison ivy plant.  You may have skin redness, very bad itching, swelling, and a rash.  Do not scratch or rub your skin.  Take or apply over-the-counter and prescription medicines only as told by your doctor. This information is not intended to replace advice given to you by your health care provider. Make sure you discuss any questions you have with your health care provider. Document Revised: 11/02/2018 Document Reviewed: 07/06/2018 Elsevier Patient Education  2020 [employer]vier Inc.  

## 2020-01-07 NOTE — Progress Notes (Signed)
BP (!) 144/81   Pulse 76   Temp 98.8 F (37.1 C) (Oral)   SpO2 98%    Subjective:    Patient ID: Darlene [name], female    DOB: March 24, 1976, 44 y.o.   MRN: [id number]  HPI: Darlene [name] is a 44 y.o. female  Chief Complaint  Patient presents with  . Rash    pt states she got into some poison ivy about a week and a half ago. Thats she is itching and has red spots with blisters on arms and legs, some on torso   RASH Present x a little over a week.  Was trimming areas in her yard, on her hands and knees.  Exposed to poison ivy, has rash bilateral lower legs, arms, torso, left breast, and around bra line.  Has been using leftover steroid at home, but has not cleared areas.  Has history of similar in March, but reports this as much worse and to several areas. Duration:  days  Location: generalized, trunk, arms and legs  Itching: yes Burning: yes Redness: yes Oozing: initially, but none now Scaling: no Blisters: no Painful: no Fevers: no Change in detergents/soaps/personal care products: no Recent illness: no Recent travel:no History of same: yes Context: fluctuating Alleviating factors: steroid cream Treatments attempted:steroid cream Shortness of breath: no  Throat/tongue swelling: no Myalgias/arthralgias: no  Relevant past medical, surgical, family and social history reviewed and updated as indicated. Interim medical history since our last visit reviewed. Allergies and medications reviewed and updated.  Review of Systems  Constitutional: Negative for activity change, appetite change, diaphoresis, fatigue and fever.  Respiratory: Negative for cough, chest tightness and shortness of breath.   Cardiovascular: Negative for chest pain, palpitations and leg swelling.  Gastrointestinal: Negative.   Skin: Positive for rash.  Neurological: Negative.   Psychiatric/Behavioral: Negative.     Per HPI unless specifically indicated above     Objective:    BP (!)  144/81   Pulse 76   Temp 98.8 F (37.1 C) (Oral)   SpO2 98%   Wt Readings from Last 3 Encounters:  10/17/19 124 lb (56.2 kg)  06/07/19 227 lb 6.4 oz (103.1 kg)  05/31/18 235 lb (106.6 kg)    Physical Exam Vitals and nursing note reviewed.  Constitutional:      General: She is awake. She is not in acute distress.    Appearance: She is well-developed, well-groomed and overweight. She is not ill-appearing.  HENT:     Head: Normocephalic.     Right Ear: Hearing normal.     Left Ear: Hearing normal.  Eyes:     General: Lids are normal.        Right eye: No discharge.        Left eye: No discharge.     Conjunctiva/sclera: Conjunctivae normal.     Pupils: Pupils are equal, round, and reactive to light.  Cardiovascular:     Rate and Rhythm: Normal rate and regular rhythm.     Heart sounds: Normal heart sounds. No murmur heard.  No gallop.   Pulmonary:     Effort: Pulmonary effort is normal. No accessory muscle usage or respiratory distress.     Breath sounds: Normal breath sounds.  Abdominal:     General: Bowel sounds are normal.     Palpations: Abdomen is soft.  Musculoskeletal:     Cervical back: Normal range of motion and neck supple.     Right lower leg: No edema.  Left lower leg: No edema.  Skin:    General: Skin is warm and dry.     Findings: Rash present.     Comments: Multiple areas of linear pattern injected rashes: to bilateral lower arms L>R, bilateral legs R>L, left lower back, left breast, and under bra line left side.  Rash is injected and linear with no vesicles, scattered areas with scaling appearance, various levels of healing.  Skin intact with no drainage.  Neurological:     Mental Status: She is alert and oriented to person, place, and time.  Psychiatric:        Attention and Perception: Attention normal.        Mood and Affect: Mood normal.        Speech: Speech normal.        Behavior: Behavior normal. Behavior is cooperative.        Thought Content:  Thought content normal.     Results for orders placed or performed in visit on 06/07/19  Microscopic Examination   URINE  Result Value Ref Range   WBC, UA None seen 0 - 5 /hpf   RBC 0-2 0 - 2 /hpf   Epithelial Cells (non renal) 0-10 0 - 10 /hpf   Mucus, UA Present Not Estab.   Bacteria, UA None seen None seen/Few  CBC with Differential/Platelet out  Result Value Ref Range   WBC 8.6 3.4 - 10.8 x10E3/uL   RBC 4.89 3.77 - 5.28 x10E6/uL   Hemoglobin 14.2 11.1 - 15.9 g/dL   Hematocrit 39.7 67.3 - 46.6 %   MCV 86 79 - 97 fL   MCH 29.0 26.6 - 33.0 pg   MCHC 33.6 31 - 35 g/dL   RDW 41.9 37.9 - 02.4 %   Platelets 331 150 - 450 x10E3/uL   Neutrophils 68 Not Estab. %   Lymphs 23 Not Estab. %   Monocytes 6 Not Estab. %   Eos 2 Not Estab. %   Basos 1 Not Estab. %   Neutrophils Absolute 5.9 1 - 7 x10E3/uL   Lymphocytes Absolute 2.0 0 - 3 x10E3/uL   Monocytes Absolute 0.5 0 - 0 x10E3/uL   EOS (ABSOLUTE) 0.2 0.0 - 0.4 x10E3/uL   Basophils Absolute 0.1 0 - 0 x10E3/uL   Immature Granulocytes 0 Not Estab. %   Immature Grans (Abs) 0.0 0.0 - 0.1 x10E3/uL  Comprehensive metabolic panel  Result Value Ref Range   Glucose 83 65 - 99 mg/dL   BUN 11 6 - 24 mg/dL   Creatinine, Ser 0.97 0.57 - 1.00 mg/dL   GFR calc non Af Amer 100 >59 mL/min/1.73   GFR calc Af Amer 115 >59 mL/min/1.73   BUN/Creatinine Ratio 15 9 - 23   Sodium 140 134 - 144 mmol/L   Potassium 4.0 3.5 - 5.2 mmol/L   Chloride 102 96 - 106 mmol/L   CO2 24 20 - 29 mmol/L   Calcium 9.3 8.7 - 10.2 mg/dL   Total Protein 7.4 6.0 - 8.5 g/dL   Albumin 4.5 3.8 - 4.8 g/dL   Globulin, Total 2.9 1.5 - 4.5 g/dL   Albumin/Globulin Ratio 1.6 1.2 - 2.2   Bilirubin Total 0.4 0.0 - 1.2 mg/dL   Alkaline Phosphatase 21 (L) 39 - 117 IU/L   AST 19 0 - 40 IU/L   ALT 15 0 - 32 IU/L  Lipid Panel w/o Chol/HDL Ratio out  Result Value Ref Range   Cholesterol, Total 186 100 - 199 mg/dL  Triglycerides 91 0 - 149 mg/dL   HDL 57 >09 mg/dL   VLDL  Cholesterol Cal 17 5 - 40 mg/dL   LDL Chol Calc (NIH) 326 (H) 0 - 99 mg/dL  TSH  Result Value Ref Range   TSH 0.972 0.450 - 4.500 uIU/mL  UA/M w/rflx Culture, Routine   Specimen: Urine   URINE  Result Value Ref Range   Specific Gravity, UA 1.025 1.005 - 1.030   pH, UA 6.0 5.0 - 7.5   Color, UA Yellow Yellow   Appearance Ur Clear Clear   Leukocytes,UA Negative Negative   Protein,UA Negative Negative/Trace   Glucose, UA Negative Negative   Ketones, UA Negative Negative   RBC, UA Trace (A) Negative   Bilirubin, UA Negative Negative   Urobilinogen, Ur 0.2 0.2 - 1.0 mg/dL   Nitrite, UA Negative Negative   Microscopic Examination See below:       Assessment & Plan:   Problem List Items Addressed This Visit      Musculoskeletal and Integument   Poison ivy dermatitis - Primary    Acute and present to multiple areas, poor response to external steroid cream only at home.  Script for Prednisone taper and refill on Clobetasol sent to pharmacy.  Recommend not to scratch areas and to wash hands well after applying cream.  Ensure to wash all linens and clothing consistently until rash has fully healed.  May take Tylenol at home for comfort. Return to office for worsening or ongoing symptoms.            Follow up plan: Return if symptoms worsen or fail to improve.

## 2020-01-07 NOTE — Assessment & Plan Note (Signed)
Acute and present to multiple areas, poor response to external steroid cream only at home.  Script for Prednisone taper and refill on Clobetasol sent to pharmacy.  Recommend not to scratch areas and to wash hands well after applying cream.  Ensure to wash all linens and clothing consistently until rash has fully healed.  May take Tylenol at home for comfort. Return to office for worsening or ongoing symptoms.

## 2020-01-08 ENCOUNTER — Ambulatory Visit: Payer: Self-pay | Admitting: Family Medicine

## 2020-02-28 ENCOUNTER — Other Ambulatory Visit: Payer: Self-pay | Admitting: Nurse Practitioner

## 2020-02-28 NOTE — Telephone Encounter (Signed)
Patient last seen in June by Corrie Dandy when this RX was written

## 2020-02-28 NOTE — Telephone Encounter (Signed)
Requested medication (s) are due for refill today - unknown  Requested medication (s) are on the active medication list -yes  Future visit scheduled -no  Last refill: 01/07/20  Notes to clinic: Rx written for acute problem-sent for review of request  Requested Prescriptions  Pending Prescriptions Disp Refills   clobetasol ointment (TEMOVATE) 0.05 % [Pharmacy Med Name: CLOBETASOL 0.05% OINTMENT] 60 g 0    Sig: APPLY TO AFFECTED AREA TWICE A DAY      Dermatology:  Corticosteroids Passed - 02/28/2020  8:56 AM      Passed - Valid encounter within last 12 months    Recent Outpatient Visits           1 month ago Poison ivy dermatitis   [facility] [place], [place] T, NP   4 months ago Rash   [facility] [place], [place], [place]   8 months ago Chronic pain of right ankle   [facility] [place], [place], [place]   1 year ago Chronic pain of right ankle   [facility] [place], [place]   1 year ago Chronic pain of right ankle   [facility] Roosvelt Maser [place], [place]                  Requested Prescriptions  Pending Prescriptions Disp Refills   clobetasol ointment (TEMOVATE) 0.05 % [Pharmacy Med Name: CLOBETASOL 0.05% OINTMENT] 60 g 0    Sig: APPLY TO AFFECTED AREA TWICE A DAY      Dermatology:  Corticosteroids Passed - 02/28/2020  8:56 AM      Passed - Valid encounter within last 12 months    Recent Outpatient Visits           1 month ago Poison ivy dermatitis   [facility] [place], [place] T, NP   4 months ago Rash   [facility] [place], [place], [place]   8 months ago Chronic pain of right ankle   [facility] [place], [place], [place]   1 year ago Chronic pain of right ankle   [facility] Particia [place], [place]   1 year ago Chronic pain of right ankle   [facility] Roosvelt Maser [place], [place]

## 2020-03-03 ENCOUNTER — Other Ambulatory Visit: Payer: Self-pay

## 2020-03-03 ENCOUNTER — Ambulatory Visit
Admission: RE | Admit: 2020-03-03 | Discharge: 2020-03-03 | Disposition: A | Discharge status: Home / Self Care | Payer: No Typology Code available for payment source | Source: Ambulatory Visit | Attending: Family Medicine | Admitting: Family Medicine

## 2020-03-03 DIAGNOSIS — Z1231 Encounter for screening mammogram for malignant neoplasm of breast: Secondary | ICD-10-CM | POA: Insufficient documentation

## 2020-03-04 ENCOUNTER — Other Ambulatory Visit: Payer: Self-pay | Admitting: Family Medicine

## 2020-03-04 ENCOUNTER — Encounter: Payer: Self-pay | Admitting: Family Medicine

## 2020-03-04 DIAGNOSIS — R921 Mammographic calcification found on diagnostic imaging of breast: Secondary | ICD-10-CM

## 2020-03-04 DIAGNOSIS — R928 Other abnormal and inconclusive findings on diagnostic imaging of breast: Secondary | ICD-10-CM

## 2020-03-09 ENCOUNTER — Ambulatory Visit
Admission: RE | Admit: 2020-03-09 | Discharge: 2020-03-09 | Disposition: A | Discharge status: Home / Self Care | Payer: No Typology Code available for payment source | Source: Ambulatory Visit | Attending: Family Medicine | Admitting: Family Medicine

## 2020-03-09 ENCOUNTER — Other Ambulatory Visit: Payer: Self-pay

## 2020-03-09 DIAGNOSIS — R921 Mammographic calcification found on diagnostic imaging of breast: Secondary | ICD-10-CM | POA: Diagnosis present

## 2020-03-09 DIAGNOSIS — R928 Other abnormal and inconclusive findings on diagnostic imaging of breast: Secondary | ICD-10-CM | POA: Diagnosis present

## 2020-04-07 ENCOUNTER — Encounter: Payer: Self-pay | Admitting: Nurse Practitioner

## 2020-04-08 ENCOUNTER — Other Ambulatory Visit: Payer: Self-pay | Admitting: Family Medicine

## 2020-04-08 NOTE — Telephone Encounter (Signed)
   Notes to clinic: review for refill  Last filled by Roosvelt Maser Patient had appointment with Corrie Dandy but canceled    Requested Prescriptions  Pending Prescriptions Disp Refills   gabapentin (NEURONTIN) 300 MG capsule [Pharmacy Med Name: GABAPENTIN 300 MG CAPSULE] 270 capsule 1    Sig: TAKE 1 CAPSULE BY MOUTH THREE TIMES A DAY      Neurology: Anticonvulsants - gabapentin Passed - 04/08/2020 12:46 PM      Passed - Valid encounter within last 12 months    Recent Outpatient Visits           3 months ago Poison ivy dermatitis   [facility] [place], [place] T, NP   5 months ago Rash   [facility] Particia [place], [place]   10 months ago Chronic pain of right ankle   [facility] Particia [place], [place]   1 year ago Chronic pain of right ankle   [facility] Roosvelt Maser [place], [place]   2 years ago Chronic pain of right ankle   [facility], [place], [place]                traMADol (ULTRAM) 50 MG tablet [Pharmacy Med Name: TRAMADOL HCL 50 MG TABLET] 40 tablet 0    Sig: TAKE 2 TABLETS BY MOUTH EVERY 6 HOURS AS NEEDED      Not Delegated - Analgesics:  Opioid Agonists Failed - 04/08/2020 12:46 PM      Failed - This refill cannot be delegated      Failed - Urine Drug Screen completed in last 360 days.      Passed - Valid encounter within last 6 months    Recent Outpatient Visits           3 months ago Poison ivy dermatitis   [facility] [place], [place] T, NP   5 months ago Rash   [facility] Roosvelt Maser [place], [place]   10 months ago Chronic pain of right ankle   [facility] Roosvelt Maser [place], [place]   1 year ago Chronic pain of right ankle   [facility] Roosvelt Maser [place], [place]   2 years ago Chronic pain of right ankle   [facility] Roosvelt Maser [place], [place]

## 2020-04-08 NOTE — Telephone Encounter (Signed)
Routing to provider  

## 2020-04-28 ENCOUNTER — Ambulatory Visit (INDEPENDENT_AMBULATORY_CARE_PROVIDER_SITE_OTHER): Payer: No Typology Code available for payment source | Admitting: Nurse Practitioner

## 2020-04-28 ENCOUNTER — Encounter: Payer: Self-pay | Admitting: Nurse Practitioner

## 2020-04-28 ENCOUNTER — Other Ambulatory Visit: Payer: Self-pay

## 2020-04-28 VITALS — BP 132/78 | HR 76 | Temp 98.2°F | Ht 67.0 in | Wt 228.0 lb

## 2020-04-28 DIAGNOSIS — M25571 Pain in right ankle and joints of right foot: Secondary | ICD-10-CM

## 2020-04-28 DIAGNOSIS — Z800 Family history of malignant neoplasm of digestive organs: Secondary | ICD-10-CM | POA: Diagnosis not present

## 2020-04-28 DIAGNOSIS — Z0000 Encounter for general adult medical examination without abnormal findings: Secondary | ICD-10-CM

## 2020-04-28 DIAGNOSIS — Z975 Presence of (intrauterine) contraceptive device: Secondary | ICD-10-CM | POA: Diagnosis not present

## 2020-04-28 DIAGNOSIS — E669 Obesity, unspecified: Secondary | ICD-10-CM | POA: Diagnosis not present

## 2020-04-28 DIAGNOSIS — Z23 Encounter for immunization: Secondary | ICD-10-CM | POA: Diagnosis not present

## 2020-04-28 DIAGNOSIS — G8929 Other chronic pain: Secondary | ICD-10-CM | POA: Diagnosis not present

## 2020-04-28 MED ORDER — TRAMADOL HCL 50 MG PO TABS
100.0000 mg | ORAL_TABLET | Freq: Four times a day (QID) | ORAL | 0 refills | Status: DC | PRN
Start: 2020-04-28 — End: 2020-10-30

## 2020-04-28 MED ORDER — GABAPENTIN 300 MG PO CAPS
ORAL_CAPSULE | ORAL | 4 refills | Status: DC
Start: 2020-04-28 — End: 2020-10-30

## 2020-04-28 NOTE — Patient Instructions (Signed)
Healthy Eating Following a healthy eating pattern may help you to achieve and maintain a healthy body weight, reduce the risk of chronic disease, and live a long and productive life. It is important to follow a healthy eating pattern at an appropriate calorie level for your body. Your nutritional needs should be met primarily through food by choosing a variety of nutrient-rich foods. What are tips for following this plan? Reading food labels  Read labels and choose the following: ? Reduced or low sodium. ? Juices with 100% fruit juice. ? Foods with low saturated fats and high polyunsaturated and monounsaturated fats. ? Foods with whole grains, such as whole wheat, cracked wheat, brown rice, and wild rice. ? Whole grains that are fortified with folic acid. This is recommended for women who are pregnant or who want to become pregnant.  Read labels and avoid the following: ? Foods with a lot of added sugars. These include foods that contain brown sugar, corn sweetener, corn syrup, dextrose, fructose, glucose, high-fructose corn syrup, honey, invert sugar, lactose, malt syrup, maltose, molasses, raw sugar, sucrose, trehalose, or turbinado sugar.  Do not eat more than the following amounts of added sugar per day:  6 teaspoons (25 g) for women.  9 teaspoons (38 g) for men. ? Foods that contain processed or refined starches and grains. ? Refined grain products, such as white flour, degermed cornmeal, white bread, and white rice. Shopping  Choose nutrient-rich snacks, such as vegetables, whole fruits, and nuts. Avoid high-calorie and high-sugar snacks, such as potato chips, fruit snacks, and candy.  Use oil-based dressings and spreads on foods instead of solid fats such as butter, stick margarine, or cream cheese.  Limit pre-made sauces, mixes, and "instant" products such as flavored rice, instant noodles, and ready-made pasta.  Try more plant-protein sources, such as tofu, tempeh, black beans,  edamame, lentils, nuts, and seeds.  Explore eating plans such as the Mediterranean diet or vegetarian diet. Cooking  Use oil to saut or stir-fry foods instead of solid fats such as butter, stick margarine, or lard.  Try baking, boiling, grilling, or broiling instead of frying.  Remove the fatty part of meats before cooking.  Steam vegetables in water or broth. Meal planning   At meals, imagine dividing your plate into fourths: ? One-half of your plate is fruits and vegetables. ? One-fourth of your plate is whole grains. ? One-fourth of your plate is protein, especially lean meats, poultry, eggs, tofu, beans, or nuts.  Include low-fat dairy as part of your daily diet. Lifestyle  Choose healthy options in all settings, including home, work, school, restaurants, or stores.  Prepare your food safely: ? Wash your hands after handling raw meats. ? Keep food preparation surfaces clean by regularly washing with hot, soapy water. ? Keep raw meats separate from ready-to-eat foods, such as fruits and vegetables. ? Cook seafood, meat, poultry, and eggs to the recommended internal temperature. ? Store foods at safe temperatures. In general:  Keep cold foods at 59F (4.4C) or below.  Keep hot foods at 159F (60C) or above.  Keep your freezer at [facility] (-17.8C) or below.  Foods are no longer safe to eat when they have been between the temperatures of 40-159F (4.4-60C) for more than 2 hours. What foods should I eat? Fruits Aim to eat 2 cup-equivalents of fresh, canned (in natural juice), or frozen fruits each day. Examples of 1 cup-equivalent of fruit include 1 small apple, 8 large strawberries, 1 cup canned fruit,  cup  dried fruit, or 1 cup 100% juice. Vegetables Aim to eat 2-3 cup-equivalents of fresh and frozen vegetables each day, including different varieties and colors. Examples of 1 cup-equivalent of vegetables include 2 medium carrots, 2 cups raw, leafy greens, 1 cup chopped  vegetable (raw or cooked), or 1 medium baked potato. Grains Aim to eat 6 ounce-equivalents of whole grains each day. Examples of 1 ounce-equivalent of grains include 1 slice of bread, 1 cup ready-to-eat cereal, 3 cups popcorn, or  cup cooked rice, pasta, or cereal. Meats and other proteins Aim to eat 5-6 ounce-equivalents of protein each day. Examples of 1 ounce-equivalent of protein include 1 egg, 1/2 cup nuts or seeds, or 1 tablespoon (16 g) peanut butter. A cut of meat or fish that is the size of a deck of cards is about 3-4 ounce-equivalents.  Of the protein you eat each week, try to have at least 8 ounces come from seafood. This includes salmon, trout, herring, and anchovies. Dairy Aim to eat 3 cup-equivalents of fat-free or low-fat dairy each day. Examples of 1 cup-equivalent of dairy include 1 cup (240 mL) milk, 8 ounces (250 g) yogurt, 1 ounces (44 g) natural cheese, or 1 cup (240 mL) fortified soy milk. Fats and oils  Aim for about 5 teaspoons (21 g) per day. Choose monounsaturated fats, such as canola and olive oils, avocados, peanut butter, and most nuts, or polyunsaturated fats, such as sunflower, corn, and soybean oils, walnuts, pine nuts, sesame seeds, sunflower seeds, and flaxseed. Beverages  Aim for six 8-oz glasses of water per day. Limit coffee to three to five 8-oz cups per day.  Limit caffeinated beverages that have added calories, such as soda and energy drinks.  Limit alcohol intake to no more than 1 drink a day for nonpregnant women and 2 drinks a day for men. One drink equals 12 oz of beer (355 mL), 5 oz of wine (148 mL), or 1 oz of hard liquor (44 mL). Seasoning and other foods  Avoid adding excess amounts of salt to your foods. Try flavoring foods with herbs and spices instead of salt.  Avoid adding sugar to foods.  Try using oil-based dressings, sauces, and spreads instead of solid fats. This information is based on general [place] nutrition guidelines. For more  information, visit [employer]. Exact amounts may vary based on your nutrition needs. Summary  A healthy eating plan may help you to maintain a healthy weight, reduce the risk of chronic diseases, and stay active throughout your life.  Plan your meals. Make sure you eat the right portions of a variety of nutrient-rich foods.  Try baking, boiling, grilling, or broiling instead of frying.  Choose healthy options in all settings, including home, work, school, restaurants, or stores. This information is not intended to replace advice given to you by your health care provider. Make sure you discuss any questions you have with your health care provider. Document Revised: 10/23/2017 Document Reviewed: 10/23/2017 Elsevier Patient Education  [place].

## 2020-04-28 NOTE — Assessment & Plan Note (Signed)
Recommended eating smaller high protein, low fat meals more frequently and exercising 30 mins a day 5 times a week with a goal of 10-15lb weight loss in the next 3 months. Patient voiced their understanding and motivation to adhere to these recommendations.  

## 2020-04-28 NOTE — Assessment & Plan Note (Signed)
Stable and under good control, continue current regimen with rare prn use of tramadol for flares.  Refills sent.  UDS obtained today, will obtain controlled subs contract next visit.

## 2020-04-28 NOTE — Progress Notes (Addendum)
BP 132/78 (BP Location: Left Arm, Patient Position: Sitting, Cuff Size: Normal)   Pulse 76   Temp 98.2 F (36.8 C) (Oral)   Ht 5\' 7"  (1.702 m)   Wt 228 lb (103.4 kg)   SpO2 97%   BMI 35.71 kg/m    Subjective:    Patient ID: Darlene [name], female    DOB: September 21, 1975, 44 y.o.   MRN: [id number]  HPI: Darlene [name] is a 44 y.o. female presenting on 04/28/2020 for comprehensive medical examination. Current medical complaints include:none  She currently lives with: husband Menopausal Symptoms: no   CHRONIC ANKLE PAIN: Tore Achille's 5 years ago, had two surgeries.  Never improved and has had ongoing chronic pain since that time.  Has done PT and massages.  Takes Gabapentin 300 MG TID and Tramadol minimally, only if hiking or vigorous exercise.  Requests refill on Tramadol today.  Mostly uses on weekend.  Last filled March 2021 for #40 tablets.    Depression Screen done today and results listed below:  Dep[facility] screen [facility]9 04/28/2020 06/07/2019 05/31/2018 03/30/2018  Decreased Interest 0 0 0 0  Down, Depressed, Hopeless 0 0 0 0  PHQ - 2 Score 0 0 0 0  Altered sleeping - - 1 1  Tired, decreased energy - - 1 -  Change in appetite - - 0 0  Feeling bad or failure about yourself  - - 0 0  Trouble concentrating - - 0 0  Moving slowly or fidgety/restless - - 0 0  Suicidal thoughts - - 0 0  PHQ-9 Score - - 2 1    The patient does not have a history of falls. I did not complete a risk assessment for falls. A plan of care for falls was not documented.   Past Medical History:  History reviewed. No pertinent past medical history.  Surgical History:  Past Surgical History:  Procedure Laterality Date  . ACHILLES TENDON REPAIR Right 2016  . ACHILLES TENDON REPAIR Right 2017    Medications:  Current Outpatient Medications on File Prior to Visit  Medication Sig  . etonogestrel (NEXPLANON) 68 MG IMPL implant 1 each by Subdermal route once.   No current facility-administered  medications on file prior to visit.    Allergies:  Allergies  Allergen Reactions  . Other     Social History:  Social History   Socioeconomic History  . Marital status: Married    Spouse name: Not on file  . Number of children: Not on file  . Years of education: Not on file  . Highest education level: Not on file  Occupational History  . Not on file  Tobacco Use  . Smoking status: Never Smoker  . Smokeless tobacco: Never Used  Vaping Use  . Vaping Use: Never used  Substance and Sexual Activity  . Alcohol use: Never  . Drug use: Never  . Sexual activity: Yes    Partners: Male    Birth control/protection: I.U.D., Implant  Other Topics Concern  . Not on file  Social History Narrative   Patient lives with husband. [place], and prior to that lived in [place]. [place] is from [place] and her family continues to reside there.    Social Determinants of Health   Financial Resource Strain:   . Difficulty of Paying Living Expenses: Not on file  Food Insecurity:   . Worried About Programme researcher, broadcasting/film/videounning Out of Food in the Last Year: Not on file  . Ran Out of  Food in the Last Year: Not on file  Transportation Needs:   . Lack of Transportation (Medical): Not on file  . Lack of Transportation (Non-Medical): Not on file  Physical Activity:   . Days of Exercise per Week: Not on file  . Minutes of Exercise per Session: Not on file  Stress:   . Feeling of Stress : Not on file  Social Connections:   . Frequency of Communication with Friends and Family: Not on file  . Frequency of Social Gatherings with Friends and Family: Not on file  . Attends Religious Services: Not on file  . Active Member of Clubs or Organizations: Not on file  . Attends Banker Meetings: Not on file  . Marital Status: Not on file  Intimate Partner Violence:   . Fear of Current or Ex-Partner: Not on file  . Emotionally Abused: Not on file  . Physically Abused: Not on file  . Sexually  Abused: Not on file   Social History   Tobacco Use  Smoking Status Never Smoker  Smokeless Tobacco Never Used   Social History   Substance and Sexual Activity  Alcohol Use Never    Family History:  Family History  Problem Relation Age of Onset  . Diabetes Mother   . Thyroid disease Mother   . Colon cancer Father   . Diabetes Father   . Heart disease Father   . Diabetes Maternal Grandmother   . Breast cancer Neg Hx     Past medical history, surgical history, medications, allergies, family history and social history reviewed with patient today and changes made to appropriate areas of the chart.   Review of Systems - negative All other ROS negative except what is listed above and in the HPI.      Objective:    BP 132/78 (BP Location: Left Arm, Patient Position: Sitting, Cuff Size: Normal)   Pulse 76   Temp 98.2 F (36.8 C) (Oral)   Ht 5\' 7"  (1.702 m)   Wt 228 lb (103.4 kg)   SpO2 97%   BMI 35.71 kg/m   Wt Readings from Last 3 Encounters:  04/28/20 228 lb (103.4 kg)  10/17/19 124 lb (56.2 kg)  06/07/19 227 lb 6.4 oz (103.1 kg)    Physical Exam Vitals and nursing note reviewed.  Constitutional:      General: She is awake. She is not in acute distress.    Appearance: She is well-developed. She is not ill-appearing.  HENT:     Head: Normocephalic and atraumatic.     Right Ear: Hearing, tympanic membrane, ear canal and external ear normal. No drainage.     Left Ear: Hearing, tympanic membrane, ear canal and external ear normal. No drainage.     Nose: Nose normal.     Right Sinus: No maxillary sinus tenderness or frontal sinus tenderness.     Left Sinus: No maxillary sinus tenderness or frontal sinus tenderness.     Mouth/Throat:     Mouth: Mucous membranes are moist.     Pharynx: Oropharynx is clear. Uvula midline. No pharyngeal swelling, oropharyngeal exudate or posterior oropharyngeal erythema.  Eyes:     General: Lids are normal.        Right eye: No  discharge.        Left eye: No discharge.     Extraocular Movements: Extraocular movements intact.     Conjunctiva/sclera: Conjunctivae normal.     Pupils: Pupils are equal, round, and reactive to light.  Visual Fields: Right eye visual fields normal and left eye visual fields normal.  Neck:     Thyroid: No thyromegaly.     Vascular: No carotid bruit.     Trachea: Trachea normal.  Cardiovascular:     Rate and Rhythm: Normal rate and regular rhythm.     Heart sounds: Normal heart sounds. No murmur heard.  No gallop.   Pulmonary:     Effort: Pulmonary effort is normal. No accessory muscle usage or respiratory distress.     Breath sounds: Normal breath sounds.  Chest:     Comments: Deferred per patient request. Abdominal:     General: Bowel sounds are normal.     Palpations: Abdomen is soft. There is no hepatomegaly or splenomegaly.     Tenderness: There is no abdominal tenderness.  Musculoskeletal:        General: Normal range of motion.     Cervical back: Normal range of motion and neck supple.     Right lower leg: No edema.     Left lower leg: No edema.  Lymphadenopathy:     Head:     Right side of head: No submental, submandibular, tonsillar, preauricular or posterior auricular adenopathy.     Left side of head: No submental, submandibular, tonsillar, preauricular or posterior auricular adenopathy.     Cervical: No cervical adenopathy.  Skin:    General: Skin is warm and dry.     Capillary Refill: Capillary refill takes less than 2 seconds.     Findings: No rash.  Neurological:     Mental Status: She is alert and oriented to person, place, and time.     Cranial Nerves: Cranial nerves are intact.     Gait: Gait is intact.     Deep Tendon Reflexes: Reflexes are normal and symmetric.     Reflex Scores:      Brachioradialis reflexes are 2+ on the right side and 2+ on the left side.      Patellar reflexes are 2+ on the right side and 2+ on the left side. Psychiatric:         Attention and Perception: Attention normal.        Mood and Affect: Mood normal.        Speech: Speech normal.        Behavior: Behavior normal. Behavior is cooperative.        Thought Content: Thought content normal.        Judgment: Judgment normal.     Results for orders placed or performed in visit on 06/07/19  Microscopic Examination   URINE  Result Value Ref Range   WBC, UA None seen 0 - 5 /hpf   RBC 0-2 0 - 2 /hpf   Epithelial Cells (non renal) 0-10 0 - 10 /hpf   Mucus, UA Present Not Estab.   Bacteria, UA None seen None seen/Few  CBC with Differential/Platelet out  Result Value Ref Range   WBC 8.6 3.4 - 10.8 x10E3/uL   RBC 4.89 3.77 - 5.28 x10E6/uL   Hemoglobin 14.2 11.1 - 15.9 g/dL   Hematocrit 37.1 69.6 - 46.6 %   MCV 86 79 - 97 fL   MCH 29.0 26.6 - 33.0 pg   MCHC 33.6 31 - 35 g/dL   RDW 78.9 38.1 - 01.7 %   Platelets 331 150 - 450 x10E3/uL   Neutrophils 68 Not Estab. %   Lymphs 23 Not Estab. %   Monocytes 6 Not Estab. %  Eos 2 Not Estab. %   Basos 1 Not Estab. %   Neutrophils Absolute 5.9 1 - 7 x10E3/uL   Lymphocytes Absolute 2.0 0 - 3 x10E3/uL   Monocytes Absolute 0.5 0 - 0 x10E3/uL   EOS (ABSOLUTE) 0.2 0.0 - 0.4 x10E3/uL   Basophils Absolute 0.1 0 - 0 x10E3/uL   Immature Granulocytes 0 Not Estab. %   Immature Grans (Abs) 0.0 0.0 - 0.1 x10E3/uL  Comprehensive metabolic panel  Result Value Ref Range   Glucose 83 65 - 99 mg/dL   BUN 11 6 - 24 mg/dL   Creatinine, Ser 6.56 0.57 - 1.00 mg/dL   GFR calc non Af Amer 100 >59 mL/min/1.73   GFR calc Af Amer 115 >59 mL/min/1.73   BUN/Creatinine Ratio 15 9 - 23   Sodium 140 134 - 144 mmol/L   Potassium 4.0 3.5 - 5.2 mmol/L   Chloride 102 96 - 106 mmol/L   CO2 24 20 - 29 mmol/L   Calcium 9.3 8.7 - 10.2 mg/dL   Total Protein 7.4 6.0 - 8.5 g/dL   Albumin 4.5 3.8 - 4.8 g/dL   Globulin, Total 2.9 1.5 - 4.5 g/dL   Albumin/Globulin Ratio 1.6 1.2 - 2.2   Bilirubin Total 0.4 0.0 - 1.2 mg/dL   Alkaline Phosphatase  21 (L) 39 - 117 IU/L   AST 19 0 - 40 IU/L   ALT 15 0 - 32 IU/L  Lipid Panel w/o Chol/HDL Ratio out  Result Value Ref Range   Cholesterol, Total 186 100 - 199 mg/dL   Triglycerides 91 0 - 149 mg/dL   HDL 57 >81 mg/dL   VLDL Cholesterol Cal 17 5 - 40 mg/dL   LDL Chol Calc (NIH) 275 (H) 0 - 99 mg/dL  TSH  Result Value Ref Range   TSH 0.972 0.450 - 4.500 uIU/mL  UA/M w/rflx Culture, Routine   Specimen: Urine   URINE  Result Value Ref Range   Specific Gravity, UA 1.025 1.005 - 1.030   pH, UA 6.0 5.0 - 7.5   Color, UA Yellow Yellow   Appearance Ur Clear Clear   Leukocytes,UA Negative Negative   Protein,UA Negative Negative/Trace   Glucose, UA Negative Negative   Ketones, UA Negative Negative   RBC, UA Trace (A) Negative   Bilirubin, UA Negative Negative   Urobilinogen, Ur 0.2 0.2 - 1.0 mg/dL   Nitrite, UA Negative Negative   Microscopic Examination See below:       Assessment & Plan:   Problem List Items Addressed This Visit      Other   Chronic pain of right ankle    Stable and under good control, continue current regimen with rare prn use of tramadol for flares.  Refills sent.  UDS obtained today, will obtain controlled subs contract next visit.      Relevant Medications   gabapentin (NEURONTIN) 300 MG capsule   traMADol (ULTRAM) 50 MG tablet   Other Relevant Orders   170017 11+Oxyco+Alc+Crt-Bund   Obesity (BMI 35.0-39.9 without comorbidity)    Recommended eating smaller high protein, low fat meals more frequently and exercising 30 mins a day 5 times a week with a goal of 10-15lb weight loss in the next 3 months. Patient voiced their understanding and motivation to adhere to these recommendations.        Other Visit Diagnoses    Annual physical exam    -  Primary   Annual labs obtained today to include CBC, TSH, lipid, CMP.  Relevant Orders   CBC with Differential/Platelet   Comprehensive metabolic panel   Lipid Panel w/o Chol/HDL Ratio   TSH   Family history  of colon cancer       Referral to GI due to family history and age >20.   Relevant Orders   Ambulatory referral to Gastroenterology   Nexplanon in place       Referral to GYN for assessment and possible replacement of Nexplanon.   Relevant Orders   Ambulatory referral to Gynecology   Needs flu shot       Flu shot today   Relevant Orders   Flu Vaccine QUAD 36+ mos IM (Completed)       Follow up plan: Return in about 1 year (around 04/28/2021) for Annual exam.   LABORATORY TESTING:  - Pap smear: up to date  IMMUNIZATIONS:   - Tdap: Tetanus vaccination status reviewed: last tetanus booster within 10 years. - Influenza: Up to date - Pneumovax: Not applicable - Prevnar: Not applicable - HPV: Not applicable - Zostavax vaccine: Not applicable  SCREENING: -Mammogram: Up to date  - Colonoscopy: family history, father had colon CA and age >54 -- referral for screening placed - Bone Density: Not applicable  -Hearing Test: Not applicable  -Spirometry: Not applicable   PATIENT COUNSELING:   Advised to take 1 mg of folate supplement per day if capable of pregnancy.   Sexuality: Discussed sexually transmitted diseases, partner selection, use of condoms, avoidance of unintended pregnancy  and contraceptive alternatives.   Advised to avoid cigarette smoking.  I discussed with the patient that most people either abstain from alcohol or drink within safe limits (<=14/week and <=4 drinks/occasion for males, <=7/weeks and <= 3 drinks/occasion for females) and that the risk for alcohol disorders and other health effects rises proportionally with the number of drinks per week and how often a drinker exceeds daily limits.  Discussed cessation/primary prevention of drug use and availability of treatment for abuse.   Diet: Encouraged to adjust caloric intake to maintain  or achieve ideal body weight, to reduce intake of dietary saturated fat and total fat, to limit sodium intake by avoiding high  sodium foods and not adding table salt, and to maintain adequate dietary potassium and calcium preferably from fresh fruits, vegetables, and low-fat dairy products.    stressed the importance of regular exercise  Injury prevention: Discussed safety belts, safety helmets, smoke detector, smoking near bedding or upholstery.   Dental health: Discussed importance of regular tooth brushing, flossing, and dental visits.    NEXT PREVENTATIVE PHYSICAL DUE IN 1 YEAR. Return in about 1 year (around 04/28/2021) for Annual exam.

## 2020-04-29 ENCOUNTER — Telehealth: Payer: Self-pay | Admitting: Obstetrics & Gynecology

## 2020-04-29 LAB — COMPREHENSIVE METABOLIC PANEL
ALT: 14 IU/L (ref 0–32)
AST: 15 IU/L (ref 0–40)
Albumin/Globulin Ratio: 1.7 (ref 1.2–2.2)
Albumin: 4.6 g/dL (ref 3.8–4.8)
Alkaline Phosphatase: 22 IU/L — ABNORMAL LOW (ref 44–121)
BUN/Creatinine Ratio: 14 (ref 9–23)
BUN: 12 mg/dL (ref 6–24)
Bilirubin Total: 0.3 mg/dL (ref 0.0–1.2)
CO2: 25 mmol/L (ref 20–29)
Calcium: 9.4 mg/dL (ref 8.7–10.2)
Chloride: 99 mmol/L (ref 96–106)
Creatinine, Ser: 0.84 mg/dL (ref 0.57–1.00)
GFR calc Af Amer: 96 mL/min/{1.73_m2} (ref >59)
GFR calc non Af Amer: 84 mL/min/{1.73_m2} (ref >59)
Globulin, Total: 2.7 g/dL (ref 1.5–4.5)
Glucose: 92 mg/dL (ref 65–99)
Potassium: 3.7 mmol/L (ref 3.5–5.2)
Sodium: 138 mmol/L (ref 134–144)
Total Protein: 7.3 g/dL (ref 6.0–8.5)

## 2020-04-29 LAB — LIPID PANEL W/O CHOL/HDL RATIO
Cholesterol, Total: 188 mg/dL (ref 100–199)
HDL: 69 mg/dL (ref >39)
LDL Chol Calc (NIH): 104 mg/dL — ABNORMAL HIGH (ref 0–99)
Triglycerides: 84 mg/dL (ref 0–149)
VLDL Cholesterol Cal: 15 mg/dL (ref 5–40)

## 2020-04-29 LAB — CBC WITH DIFFERENTIAL/PLATELET
Basophils Absolute: 0.1 10*3/uL (ref 0.0–0.2)
Basos: 1 %
EOS (ABSOLUTE): 0.3 10*3/uL (ref 0.0–0.4)
Eos: 3 %
Hematocrit: 41.9 % (ref 34.0–46.6)
Hemoglobin: 13.9 g/dL (ref 11.1–15.9)
Immature Grans (Abs): 0 10*3/uL (ref 0.0–0.1)
Immature Granulocytes: 0 %
Lymphocytes Absolute: 2.4 10*3/uL (ref 0.7–3.1)
Lymphs: 26 %
MCH: 29.1 pg (ref 26.6–33.0)
MCHC: 33.2 g/dL (ref 31.5–35.7)
MCV: 88 fL (ref 79–97)
Monocytes Absolute: 0.7 10*3/uL (ref 0.1–0.9)
Monocytes: 8 %
Neutrophils Absolute: 5.7 10*3/uL (ref 1.4–7.0)
Neutrophils: 62 %
Platelets: 297 10*3/uL (ref 150–450)
RBC: 4.78 x10E6/uL (ref 3.77–5.28)
RDW: 11.8 % (ref 11.7–15.4)
WBC: 9.2 10*3/uL (ref 3.4–10.8)

## 2020-04-29 LAB — TSH: TSH: 1.06 u[IU]/mL (ref 0.450–4.500)

## 2020-04-29 NOTE — Telephone Encounter (Signed)
[facility] referring for nexplanon replcement. Called and left voicemail for patient to call back to be scheduled.

## 2020-04-29 NOTE — Progress Notes (Signed)
Contacted via MyChart The 10-year ASCVD risk score Denman George DC Jr., et al., 2013) is: 0.6%   Values used to calculate the score:     Age: 44 years     Sex: Female     Is Non-Hispanic African American: No     Diabetic: No     Tobacco smoker: No     Systolic Blood Pressure: 132 mmHg     Is BP treated: No     HDL Cholesterol: 69 mg/dL     Total Cholesterol: 188 mg/dL Good afternoon Datha, your labs have returned and overall look great.  Your LDL is above normal. The LDL is the bad cholesterol. Over time and in combination with inflammation and other factors, this contributes to plaque which in turn may lead to stroke and/or heart attack down the road. Sometimes high LDL is primarily genetic, and people might be eating all the right foods but still have high numbers. Other times, there is room for improvement in one's diet and eating healthier can bring this number down and potentially reduce one's risk of heart attack and/or stroke.   To reduce your LDL, Remember - more fruits and vegetables, more fish, and limit red meat and dairy products. More soy, nuts, beans, barley, lentils, oats and plant sterol ester enriched margarine instead of butter. I also encourage eliminating sugar and processed food. Remember, shop on the outside of the grocery store and visit your [employer]. If you would like to talk with me about dietary changes for your cholesterol, please let me know. We should recheck your cholesterol in 12 months.  Any questions? Keep being awesome!!  Thank you for allowing me to participate in your care. Kindest regards, Janiel Derhammer

## 2020-04-30 LAB — DRUG SCREEN 764883 11+OXYCO+ALC+CRT-BUND
Amphetamines, Urine: NEGATIVE ng/mL
BENZODIAZ UR QL: NEGATIVE ng/mL
Barbiturate: NEGATIVE ng/mL
Cannabinoid Quant, Ur: NEGATIVE ng/mL
Cocaine (Metabolite): NEGATIVE ng/mL
Creatinine: 60.2 mg/dL (ref 20.0–300.0)
Ethanol: NEGATIVE %
Meperidine: NEGATIVE ng/mL
Methadone Screen, Urine: NEGATIVE ng/mL
OPIATE SCREEN URINE: NEGATIVE ng/mL
Oxycodone/Oxymorphone, Urine: NEGATIVE ng/mL
Phencyclidine: NEGATIVE ng/mL
Propoxyphene: NEGATIVE ng/mL
Tramadol: NEGATIVE ng/mL
pH, Urine: 5.8 (ref 4.5–8.9)

## 2020-05-06 NOTE — Telephone Encounter (Signed)
Called and left voicemail for patient to call back to be scheduled. 

## 2020-05-07 NOTE — Telephone Encounter (Signed)
Called and left voicemail for patient to call back to be scheduled. 

## 2020-05-07 NOTE — Telephone Encounter (Signed)
Patient schedule is scheduled for possible nexplanon replacement in [place] on 06/10/20 with [facility]MS

## 2020-05-11 ENCOUNTER — Encounter: Payer: Self-pay | Admitting: *Deleted

## 2020-05-11 NOTE — Telephone Encounter (Signed)
Noted. Will order to arrive by apt date/time. 

## 2020-06-10 ENCOUNTER — Encounter: Payer: Self-pay | Admitting: Obstetrics and Gynecology

## 2020-06-10 ENCOUNTER — Ambulatory Visit (INDEPENDENT_AMBULATORY_CARE_PROVIDER_SITE_OTHER): Payer: No Typology Code available for payment source | Admitting: Obstetrics and Gynecology

## 2020-06-10 ENCOUNTER — Other Ambulatory Visit: Payer: Self-pay

## 2020-06-10 VITALS — BP 135/80 | Ht 67.0 in | Wt 227.0 lb

## 2020-06-10 DIAGNOSIS — N939 Abnormal uterine and vaginal bleeding, unspecified: Secondary | ICD-10-CM

## 2020-06-10 MED ORDER — ESTROGENS CONJUGATED 1.25 MG PO TABS: 1.2500 mg | ORAL_TABLET | Freq: Every day | ORAL | 0 refills | Status: DC

## 2020-06-10 NOTE — Progress Notes (Signed)
Obstetrics & Gynecology Office Visit   Chief Complaint:  Chief Complaint  Patient presents with  . Establish Care    ? replacement of Nexplanon    History of Present Illness: 44 y.o. presenting for consultation regarding Nexplanon replacement.  Current Nexplanon placed 08/2017.  She has started noting increased breakthrough bleeding on this current nexplanon and wonders about need for earlier replacement.     Review of Systems: Negative unless noted in HPI  Past Medical History:  No past medical history on file.  Past Surgical History:  Past Surgical History:  Procedure Laterality Date  . ACHILLES TENDON REPAIR Right 2016  . ACHILLES TENDON REPAIR Right 2017    Gynecologic History: No LMP recorded. Patient has had an implant.  Obstetric History: No obstetric history on file.  Family History:  Family History  Problem Relation Age of Onset  . Diabetes Mother   . Thyroid disease Mother   . Colon cancer Father   . Diabetes Father   . Heart disease Father   . Diabetes Maternal Grandmother   . Breast cancer Neg Hx     Social History:  Social History   Socioeconomic History  . Marital status: Married    Spouse name: Not on file  . Number of children: Not on file  . Years of education: Not on file  . Highest education level: Not on file  Occupational History  . Not on file  Tobacco Use  . Smoking status: Never Smoker  . Smokeless tobacco: Never Used  Vaping Use  . Vaping Use: Never used  Substance and Sexual Activity  . Alcohol use: Never  . Drug use: Never  . Sexual activity: Yes    Partners: Male    Birth control/protection: I.U.D., Implant  Other Topics Concern  . Not on file  Social History Narrative   Patient lives with husband. Recently relocated to [facility] from [place], and prior to that lived in [place]. Patient is from [place] and her family continues to reside there.    Social Determinants of Health   Financial Resource Strain:   . Difficulty of  Paying Living Expenses: Not on file  Food Insecurity:   . Worried About Programme researcher, broadcasting/film/video in the Last Year: Not on file  . Ran Out of Food in the Last Year: Not on file  Transportation Needs:   . Lack of Transportation (Medical): Not on file  . Lack of Transportation (Non-Medical): Not on file  Physical Activity:   . Days of Exercise per Week: Not on file  . Minutes of Exercise per Session: Not on file  Stress:   . Feeling of Stress : Not on file  Social Connections:   . Frequency of Communication with Friends and Family: Not on file  . Frequency of Social Gatherings with Friends and Family: Not on file  . Attends Religious Services: Not on file  . Active Member of Clubs or Organizations: Not on file  . Attends Banker Meetings: Not on file  . Marital Status: Not on file  Intimate Partner Violence:   . Fear of Current or Ex-Partner: Not on file  . Emotionally Abused: Not on file  . Physically Abused: Not on file  . Sexually Abused: Not on file    Allergies:  Allergies  Allergen Reactions  . Other     Medications: Prior to Admission medications   Medication Sig Start Date End Date Taking? Authorizing Provider  etonogestrel (NEXPLANON) 68 MG  IMPL implant 1 each by Subdermal route once.   Yes [provider]  gabapentin (NEURONTIN) 300 MG capsule TAKE 1 CAPSULE BY MOUTH THREE TIMES A DAY 04/28/20  Yes Cannady, Jolene T, NP  traMADol (ULTRAM) 50 MG tablet Take 2 tablets (100 mg total) by mouth every 6 (six) hours as needed. 04/28/20  Yes Cannady, Jolene T, NP  estrogens, conjugated, (PREMARIN) 1.25 MG tablet Take 1 tablet (1.25 mg total) by mouth daily for 28 days. 06/10/20 07/08/20  Vena Austria, MD    Physical Exam Vitals:  Vitals:   06/10/20 1519  BP: 135/80   No LMP recorded. Patient has had an implant.  General: NAD, well nourished, appears stated age HEENT: normocephalic, anicteric Pulmonary: No increased work of breathing Neurologic:  Grossly intact Psychiatric: mood appropriate, affect full  Female chaperone present for pelvic  portions of the physical exam  Assessment: 44 y.o. evaluation AUB-I  Plan: Problem List Items Addressed This Visit    None    Visit Diagnoses    Abnormal uterine bleeding    -  Primary     1) AUB-I - suspect breakthrough bleeding from nexplanon.  We discussed bleeding profile that may be seen with nexplanon.  Breakthrough bleeding may happen at any time during the nexplanon lifespan but is most commonly seen in the first 6 months.  This is the patient's second nexplanon, and it is due to be exchanged in February of 2022.  We discussed FDA indication for use up to 3 year with data supporting use up to 4 year. Often time bleeding maybe improved with a short course of estrogen therapy to thicken and stabilize the endometrial lining  2) Pap - will arrange for annual in the next 2 weeks and assess response to estrogen therapy  3) Return in about 2 weeks (around 06/24/2020) for Annual.   Vena Austria, MD, [facility], [facility]

## 2020-06-10 NOTE — Progress Notes (Signed)
EST care, ? Replacing nexplanon. Procedure room

## 2020-06-25 ENCOUNTER — Ambulatory Visit: Payer: No Typology Code available for payment source | Admitting: Obstetrics and Gynecology

## 2020-08-25 LAB — RESULTS CONSOLE HPV: CHL HPV: NEGATIVE

## 2020-08-29 LAB — HM PAP SMEAR

## 2020-10-21 ENCOUNTER — Telehealth: Payer: Self-pay | Admitting: Nurse Practitioner

## 2020-10-21 DIAGNOSIS — R928 Other abnormal and inconclusive findings on diagnostic imaging of breast: Secondary | ICD-10-CM

## 2020-10-21 NOTE — Telephone Encounter (Signed)
Pt had mammogram on 03-09-2020 at [facility] and needs a diagnostic left breast mammogram according to report phone number is [phone]

## 2020-10-26 ENCOUNTER — Other Ambulatory Visit: Payer: Self-pay | Admitting: Nurse Practitioner

## 2020-10-26 NOTE — Telephone Encounter (Signed)
Pt scheduled for apt.

## 2020-10-26 NOTE — Telephone Encounter (Signed)
Requested medication (s) are due for refill today: no  Requested medication (s) are on the active medication list: yes  Last refill: 04/28/2020  Future visit scheduled:  no  Notes to clinic:  this refill cannot be delegated    Requested Prescriptions  Pending Prescriptions Disp Refills   traMADol (ULTRAM) 50 MG tablet [Pharmacy Med Name: TRAMADOL HCL 50 MG TABLET] 40 tablet     Sig: Take 2 tablets (100 mg total) by mouth every 6 (six) hours as needed.      Not Delegated - Analgesics:  Opioid Agonists Failed - 10/26/2020  9:34 AM      Failed - This refill cannot be delegated      Failed - Valid encounter within last 6 months    Recent Outpatient Visits           6 months ago Annual physical exam   [facility] Cannady, Dorie Rank, NP   9 months ago Poison ivy dermatitis   [facility] Milburn, [place] T, NP   1 year ago Rash   [facility] Particia [place], [place]   1 year ago Chronic pain of right ankle   [facility] Particia [place], [place]   2 years ago Chronic pain of right ankle   [facility] Ro[place] Maser [place], [place]                Passed - Urine Drug Screen completed in last 360 days

## 2020-10-29 NOTE — Progress Notes (Signed)
BP (!) 157/96   Pulse 82   Temp 98.7 F (37.1 C)   Wt 230 lb (104.3 kg)   SpO2 96%   BMI 36.02 kg/m    Subjective:    Patient ID: Darlene [name], female    DOB: [birth year], 45 y.o.   MRN: [id number]  HPI: Darlene [name] is a 45 y.o. female  Chief Complaint  Patient presents with  . Ankle Pain   CHRONIC ANKLE PAIN: Tore Achille's in 2016, had two surgeries.  Never improved and has had ongoing chronic pain since that time.  Has done PT and massages.  Takes Gabapentin 300 MG TID and Tramadol minimally, only if hiking or vigorous exercise.  Requests refill on Tramadol today.  Mostly uses on weekend.  Last filled October 2021 for #40 tablets.    Elevated Blood Pressure Denies HA, CP, SOB, dizziness, palpitations, visual changes, and lower extremity swelling.   Relevant past medical, surgical, family and social history reviewed and updated as indicated. Interim medical history since our last visit reviewed. Allergies and medications reviewed and updated.  Review of Systems  Eyes: Negative for visual disturbance.  Respiratory: Negative for cough, chest tightness and shortness of breath.   Cardiovascular: Negative for chest pain, palpitations and leg swelling.  Musculoskeletal:       Chronic ankle pain  Neurological: Negative for dizziness and headaches.    Per HPI unless specifically indicated above     Objective:    BP (!) 157/96   Pulse 82   Temp 98.7 F (37.1 C)   Wt 230 lb (104.3 kg)   SpO2 96%   BMI 36.02 kg/m   Wt Readings from Last 3 Encounters:  10/30/20 230 lb (104.3 kg)  06/10/20 227 lb (103 kg)  04/28/20 228 lb (103.4 kg)    Physical Exam Vitals and nursing note reviewed.  Constitutional:      General: She is not in acute distress.    Appearance: Normal appearance. She is normal weight. She is not ill-appearing, toxic-appearing or diaphoretic.  HENT:     Head: Normocephalic.     Right Ear: External ear normal.     Left Ear: External ear  normal.     Nose: Nose normal.     Mouth/Throat:     Mouth: Mucous membranes are moist.     Pharynx: Oropharynx is clear.  Eyes:     General:        Right eye: No discharge.        Left eye: No discharge.     Extraocular Movements: Extraocular movements intact.     Conjunctiva/sclera: Conjunctivae normal.     Pupils: Pupils are equal, round, and reactive to light.  Cardiovascular:     Rate and Rhythm: Normal rate and regular rhythm.     Heart sounds: No murmur heard.   Pulmonary:     Effort: Pulmonary effort is normal. No respiratory distress.     Breath sounds: Normal breath sounds. No wheezing or rales.  Musculoskeletal:     Cervical back: Normal range of motion and neck supple.  Skin:    General: Skin is warm and dry.     Capillary Refill: Capillary refill takes less than 2 seconds.  Neurological:     General: No focal deficit present.     Mental Status: She is alert and oriented to person, place, and time. Mental status is at baseline.  Psychiatric:        Mood and Affect: Mood normal.  Behavior: Behavior normal.        Thought Content: Thought content normal.        Judgment: Judgment normal.     Results for orders placed or performed in visit on 04/28/20  [id number] 11+Oxyco+Alc+Crt-Bund  Result Value Ref Range   Ethanol Negative Cutoff=0.020 %   Amphetamines, Urine Negative Cutoff=1000 ng/mL   Barbiturate Negative Cutoff=200 ng/mL   BENZODIAZ UR QL Negative Cutoff=200 ng/mL   Cannabinoid Quant, Ur Negative Cutoff=50 ng/mL   Cocaine (Metabolite) Negative Cutoff=300 ng/mL   OPIATE SCREEN URINE Negative Cutoff=300 ng/mL   Oxycodone/Oxymorphone, Urine Negative Cutoff=300 ng/mL   Phencyclidine Negative Cutoff=25 ng/mL   Methadone Screen, Urine Negative Cutoff=300 ng/mL   Propoxyphene Negative Cutoff=300 ng/mL   Meperidine Negative Cutoff=200 ng/mL   Tramadol Negative Cutoff=200 ng/mL   Creatinine 60.2 20.0 - 300.0 mg/dL   pH, Urine 5.8 4.5 - 8.9  CBC with  Differential/Platelet  Result Value Ref Range   WBC 9.2 3.4 - 10.8 x10E3/uL   RBC 4.78 3.77 - 5.28 x10E6/uL   Hemoglobin 13.9 11.1 - 15.9 g/dL   Hematocrit 73.4 19.3 - 46.6 %   MCV 88 79 - 97 fL   MCH 29.1 26.6 - 33.0 pg   MCHC 33.2 31.5 - 35.7 g/dL   RDW 79.0 24.0 - 97.3 %   Platelets 297 150 - 450 x10E3/uL   Neutrophils 62 Not Estab. %   Lymphs 26 Not Estab. %   Monocytes 8 Not Estab. %   Eos 3 Not Estab. %   Basos 1 Not Estab. %   Neutrophils Absolute 5.7 1.4 - 7.0 x10E3/uL   Lymphocytes Absolute 2.4 0.7 - 3.1 x10E3/uL   Monocytes Absolute 0.7 0.1 - 0.9 x10E3/uL   EOS (ABSOLUTE) 0.3 0.0 - 0.4 x10E3/uL   Basophils Absolute 0.1 0.0 - 0.2 x10E3/uL   Immature Granulocytes 0 Not Estab. %   Immature Grans (Abs) 0.0 0.0 - 0.1 x10E3/uL  Comprehensive metabolic panel  Result Value Ref Range   Glucose 92 65 - 99 mg/dL   BUN 12 6 - 24 mg/dL   Creatinine, Ser 5.32 0.57 - 1.00 mg/dL   GFR calc non Af Amer 84 >59 mL/min/1.73   GFR calc Af Amer 96 >59 mL/min/1.73   BUN/Creatinine Ratio 14 9 - 23   Sodium 138 134 - 144 mmol/L   Potassium 3.7 3.5 - 5.2 mmol/L   Chloride 99 96 - 106 mmol/L   CO2 25 20 - 29 mmol/L   Calcium 9.4 8.7 - 10.2 mg/dL   Total Protein 7.3 6.0 - 8.5 g/dL   Albumin 4.6 3.8 - 4.8 g/dL   Globulin, Total 2.7 1.5 - 4.5 g/dL   Albumin/Globulin Ratio 1.7 1.2 - 2.2   Bilirubin Total 0.3 0.0 - 1.2 mg/dL   Alkaline Phosphatase 22 (L) 44 - 121 IU/L   AST 15 0 - 40 IU/L   ALT 14 0 - 32 IU/L  Lipid Panel w/o Chol/HDL Ratio  Result Value Ref Range   Cholesterol, Total 188 100 - 199 mg/dL   Triglycerides 84 0 - 149 mg/dL   HDL 69 >99 mg/dL   VLDL Cholesterol Cal 15 5 - 40 mg/dL   LDL Chol Calc (NIH) 242 (H) 0 - 99 mg/dL  TSH  Result Value Ref Range   TSH 1.060 0.450 - 4.500 uIU/mL      Assessment & Plan:   Problem List Items Addressed This Visit      Other   Chronic pain of  right ankle - Primary    Stable and under good control, continue current regimen with  rare prn use of tramadol for flares.  Refills sent.  UDS obtained last visit.  Controlled substance agreement obtained today. Will see her every 6 months due to refill only being every 6 months.       Relevant Medications   gabapentin (NEURONTIN) 300 MG capsule   traMADol (ULTRAM) 50 MG tablet    Other Visit Diagnoses    Screening for colon cancer       Relevant Orders   Ambulatory referral to Gastroenterology   Elevated blood pressure reading       Two elevated blood pressure readings.  Recommend checking at home. Return in 1 month for reevaluation.        Follow up plan: Return in about 1 month (around 11/29/2020) for BP Check.   A total of 30 minutes were spent on this encounter today.  When total time is documented, this includes both the face-to-face and non-face-to-face time personally spent before, during and after the visit on the date of the encounter.

## 2020-10-30 ENCOUNTER — Encounter: Payer: Self-pay | Admitting: Nurse Practitioner

## 2020-10-30 ENCOUNTER — Other Ambulatory Visit: Payer: Self-pay

## 2020-10-30 ENCOUNTER — Ambulatory Visit (INDEPENDENT_AMBULATORY_CARE_PROVIDER_SITE_OTHER): Payer: No Typology Code available for payment source | Admitting: Nurse Practitioner

## 2020-10-30 VITALS — BP 157/96 | HR 82 | Temp 98.7°F | Wt 230.0 lb

## 2020-10-30 DIAGNOSIS — Z1211 Encounter for screening for malignant neoplasm of colon: Secondary | ICD-10-CM | POA: Diagnosis not present

## 2020-10-30 DIAGNOSIS — M25571 Pain in right ankle and joints of right foot: Secondary | ICD-10-CM

## 2020-10-30 DIAGNOSIS — R030 Elevated blood-pressure reading, without diagnosis of hypertension: Secondary | ICD-10-CM | POA: Diagnosis not present

## 2020-10-30 DIAGNOSIS — G8929 Other chronic pain: Secondary | ICD-10-CM

## 2020-10-30 MED ORDER — GABAPENTIN 300 MG PO CAPS: ORAL_CAPSULE | ORAL | 1 refills | Status: DC

## 2020-10-30 MED ORDER — TRAMADOL HCL 50 MG PO TABS
100.0000 mg | ORAL_TABLET | Freq: Four times a day (QID) | ORAL | 0 refills | Status: DC | PRN
Start: 2020-10-30 — End: 2021-05-05

## 2020-10-30 NOTE — Assessment & Plan Note (Addendum)
Stable and under good control, continue current regimen with rare prn use of tramadol for flares.  Refills sent.  UDS obtained last visit.  Controlled substance agreement obtained today. Will see her every 6 months due to refill only being every 6 months.

## 2020-11-02 ENCOUNTER — Telehealth: Payer: Self-pay

## 2020-11-02 DIAGNOSIS — R921 Mammographic calcification found on diagnostic imaging of breast: Secondary | ICD-10-CM

## 2020-11-02 NOTE — Telephone Encounter (Signed)
Patient notified

## 2020-11-02 NOTE — Telephone Encounter (Signed)
Orders placed.

## 2020-11-02 NOTE — Telephone Encounter (Signed)
Spoke with [facility] they need the following orders, can they be placed  Per Darlene [name] at [facility] there are no ultrasound orders in there. She needs a 6 month Diag [facility] Lt [facility] Mammo for Left Breast Calcifications followup- [id number], she will also need a Left BR limited ultrasound - [id number].

## 2020-11-10 ENCOUNTER — Other Ambulatory Visit: Payer: No Typology Code available for payment source

## 2020-11-17 ENCOUNTER — Ambulatory Visit
Admission: RE | Admit: 2020-11-17 | Discharge: 2020-11-17 | Disposition: A | Discharge status: Home / Self Care | Payer: No Typology Code available for payment source | Source: Ambulatory Visit | Attending: Nurse Practitioner | Admitting: Nurse Practitioner

## 2020-11-17 ENCOUNTER — Other Ambulatory Visit: Payer: Self-pay

## 2020-11-17 DIAGNOSIS — R921 Mammographic calcification found on diagnostic imaging of breast: Secondary | ICD-10-CM | POA: Insufficient documentation

## 2020-11-18 ENCOUNTER — Other Ambulatory Visit: Payer: Self-pay | Admitting: Nurse Practitioner

## 2020-11-18 ENCOUNTER — Encounter: Payer: Self-pay | Admitting: *Deleted

## 2020-11-18 DIAGNOSIS — R928 Other abnormal and inconclusive findings on diagnostic imaging of breast: Secondary | ICD-10-CM

## 2020-11-18 NOTE — Progress Notes (Signed)
Please let patient know that her mammogram showed two groups of calcifications in her left breast.  The radiologist recommends a diagnostic mammogram in 4 months.  I have placed this order.

## 2020-12-02 ENCOUNTER — Ambulatory Visit: Payer: No Typology Code available for payment source | Admitting: Nurse Practitioner

## 2021-01-05 ENCOUNTER — Other Ambulatory Visit: Payer: Self-pay

## 2021-01-05 ENCOUNTER — Ambulatory Visit (INDEPENDENT_AMBULATORY_CARE_PROVIDER_SITE_OTHER): Payer: BC Managed Care – PPO | Admitting: Nurse Practitioner

## 2021-01-05 ENCOUNTER — Encounter: Payer: Self-pay | Admitting: Nurse Practitioner

## 2021-01-05 VITALS — BP 132/85 | HR 78 | Temp 98.2°F | Ht 66.97 in | Wt 227.0 lb

## 2021-01-05 DIAGNOSIS — I10 Essential (primary) hypertension: Secondary | ICD-10-CM | POA: Diagnosis not present

## 2021-01-05 MED ORDER — LISINOPRIL 10 MG PO TABS: 10.0000 mg | ORAL_TABLET | Freq: Every day | ORAL | 0 refills | Status: DC

## 2021-01-05 NOTE — Assessment & Plan Note (Signed)
New Problem.  Stable.  Patient's blood pressures are elevated at home and at office visits. Discussed increased risk of stroke and heart attack with untreated hypertension.  Patient agrees to start daily medication.  Will start Lisinopril daily.  Continue checking blood pressures at home. Side effects and benefits of medication discussed with patient during visit today.Follow up in 1 month for reevaluation.

## 2021-01-05 NOTE — Progress Notes (Signed)
BP 132/85   Pulse 78   Temp 98.2 F (36.8 C)   Ht 5' 6.97" (1.701 m)   Wt 227 lb (103 kg)   SpO2 96%   BMI 35.59 kg/m    Subjective:    Patient ID: Darlene [name], female    DOB: Nov 27, 1975, 45 y.o.   MRN: [id number]  HPI: Darlene [name] is a 45 y.o. female  Chief Complaint  Patient presents with   Hypertension    ELEVATED BLOOD PRESSURES Patient has been checking at home.  They are running 130-150/70s.  Patient is hesitant to start medication.  Denies HA, CP, SOB, dizziness, palpitations, visual changes, and lower extremity swelling.   Relevant past medical, surgical, family and social history reviewed and updated as indicated. Interim medical history since our last visit reviewed. Allergies and medications reviewed and updated.  Review of Systems  Eyes:  Negative for visual disturbance.  Respiratory:  Negative for cough, chest tightness and shortness of breath.   Cardiovascular:  Negative for chest pain, palpitations and leg swelling.  Neurological:  Negative for dizziness and headaches.   Per HPI unless specifically indicated above     Objective:    BP 132/85   Pulse 78   Temp 98.2 F (36.8 C)   Ht 5' 6.97" (1.701 m)   Wt 227 lb (103 kg)   SpO2 96%   BMI 35.59 kg/m   Wt Readings from Last 3 Encounters:  01/05/21 227 lb (103 kg)  10/30/20 230 lb (104.3 kg)  06/10/20 227 lb (103 kg)    Physical Exam Vitals and nursing note reviewed.  Constitutional:      General: She is not in acute distress.    Appearance: Normal appearance. She is normal weight. She is not ill-appearing, toxic-appearing or diaphoretic.  HENT:     Head: Normocephalic.     Right Ear: External ear normal.     Left Ear: External ear normal.     Nose: Nose normal.     Mouth/Throat:     Mouth: Mucous membranes are moist.     Pharynx: Oropharynx is clear.  Eyes:     General:        Right eye: No discharge.        Left eye: No discharge.     Extraocular Movements: Extraocular  movements intact.     Conjunctiva/sclera: Conjunctivae normal.     Pupils: Pupils are equal, round, and reactive to light.  Cardiovascular:     Rate and Rhythm: Normal rate and regular rhythm.     Heart sounds: No murmur heard. Pulmonary:     Effort: Pulmonary effort is normal. No respiratory distress.     Breath sounds: Normal breath sounds. No wheezing or rales.  Musculoskeletal:     Cervical back: Normal range of motion and neck supple.  Skin:    General: Skin is warm and dry.     Capillary Refill: Capillary refill takes less than 2 seconds.  Neurological:     General: No focal deficit present.     Mental Status: She is alert and oriented to person, place, and time. Mental status is at baseline.  Psychiatric:        Mood and Affect: Mood normal.        Behavior: Behavior normal.        Thought Content: Thought content normal.        Judgment: Judgment normal.    Results for orders placed or performed in visit on 04/28/20  [id number] 11+Oxyco+Alc+Crt-Bund  Result Value Ref Range   Ethanol Negative Cutoff=0.020 %   Amphetamines, Urine Negative Cutoff=1000 ng/mL   Barbiturate Negative Cutoff=200 ng/mL   BENZODIAZ UR QL Negative Cutoff=200 ng/mL   Cannabinoid Quant, Ur Negative Cutoff=50 ng/mL   Cocaine (Metabolite) Negative Cutoff=300 ng/mL   OPIATE SCREEN URINE Negative Cutoff=300 ng/mL   Oxycodone/Oxymorphone, Urine Negative Cutoff=300 ng/mL   Phencyclidine Negative Cutoff=25 ng/mL   Methadone Screen, Urine Negative Cutoff=300 ng/mL   Propoxyphene Negative Cutoff=300 ng/mL   Meperidine Negative Cutoff=200 ng/mL   Tramadol Negative Cutoff=200 ng/mL   Creatinine 60.2 20.0 - 300.0 mg/dL   pH, Urine 5.8 4.5 - 8.9  CBC with Differential/Platelet  Result Value Ref Range   WBC 9.2 3.4 - 10.8 x10E3/uL   RBC 4.78 3.77 - 5.28 x10E6/uL   Hemoglobin 13.9 11.1 - 15.9 g/dL   Hematocrit 23.5 36.1 - 46.6 %   MCV 88 79 - 97 fL   MCH 29.1 26.6 - 33.0 pg   MCHC 33.2 31.5 - 35.7 g/dL    RDW 44.3 15.4 - 00.8 %   Platelets 297 150 - 450 x10E3/uL   Neutrophils 62 Not Estab. %   Lymphs 26 Not Estab. %   Monocytes 8 Not Estab. %   Eos 3 Not Estab. %   Basos 1 Not Estab. %   Neutrophils Absolute 5.7 1.4 - 7.0 x10E3/uL   Lymphocytes Absolute 2.4 0.7 - 3.1 x10E3/uL   Monocytes Absolute 0.7 0.1 - 0.9 x10E3/uL   EOS (ABSOLUTE) 0.3 0.0 - 0.4 x10E3/uL   Basophils Absolute 0.1 0.0 - 0.2 x10E3/uL   Immature Granulocytes 0 Not Estab. %   Immature Grans (Abs) 0.0 0.0 - 0.1 x10E3/uL  Comprehensive metabolic panel  Result Value Ref Range   Glucose 92 65 - 99 mg/dL   BUN 12 6 - 24 mg/dL   Creatinine, Ser 6.76 0.57 - 1.00 mg/dL   GFR calc non Af Amer 84 >59 mL/min/1.73   GFR calc Af Amer 96 >59 mL/min/1.73   BUN/Creatinine Ratio 14 9 - 23   Sodium 138 134 - 144 mmol/L   Potassium 3.7 3.5 - 5.2 mmol/L   Chloride 99 96 - 106 mmol/L   CO2 25 20 - 29 mmol/L   Calcium 9.4 8.7 - 10.2 mg/dL   Total Protein 7.3 6.0 - 8.5 g/dL   Albumin 4.6 3.8 - 4.8 g/dL   Globulin, Total 2.7 1.5 - 4.5 g/dL   Albumin/Globulin Ratio 1.7 1.2 - 2.2   Bilirubin Total 0.3 0.0 - 1.2 mg/dL   Alkaline Phosphatase 22 (L) 44 - 121 IU/L   AST 15 0 - 40 IU/L   ALT 14 0 - 32 IU/L  Lipid Panel w/o Chol/HDL Ratio  Result Value Ref Range   Cholesterol, Total 188 100 - 199 mg/dL   Triglycerides 84 0 - 149 mg/dL   HDL 69 >19 mg/dL   VLDL Cholesterol Cal 15 5 - 40 mg/dL   LDL Chol Calc (NIH) 509 (H) 0 - 99 mg/dL  TSH  Result Value Ref Range   TSH 1.060 0.450 - 4.500 uIU/mL      Assessment & Plan:   Problem List Items Addressed This Visit       Cardiovascular and Mediastinum   Primary hypertension - Primary    New Problem.  Stable.  Patient's blood pressures are elevated at home and at office visits. Discussed increased risk of stroke and heart attack with untreated hypertension.  Patient agrees to start daily  medication.  Will start Lisinopril daily.  Continue checking blood pressures at home. Side effects  and benefits of medication discussed with patient during visit today.Follow up in 1 month for reevaluation.        Relevant Medications   lisinopril (ZESTRIL) 10 MG tablet     Follow up plan: Return in about 1 month (around 02/04/2021) for BP Check.

## 2021-02-04 ENCOUNTER — Ambulatory Visit: Payer: BC Managed Care – PPO | Admitting: Nurse Practitioner

## 2021-03-05 ENCOUNTER — Ambulatory Visit: Payer: BC Managed Care – PPO | Admitting: Nurse Practitioner

## 2021-04-05 ENCOUNTER — Encounter: Payer: Self-pay | Admitting: Nurse Practitioner

## 2021-04-05 ENCOUNTER — Ambulatory Visit (INDEPENDENT_AMBULATORY_CARE_PROVIDER_SITE_OTHER): Payer: BC Managed Care – PPO | Admitting: Nurse Practitioner

## 2021-04-05 ENCOUNTER — Other Ambulatory Visit: Payer: Self-pay

## 2021-04-05 VITALS — BP 124/74 | HR 72 | Temp 98.8°F | Wt 225.4 lb

## 2021-04-05 DIAGNOSIS — G8929 Other chronic pain: Secondary | ICD-10-CM

## 2021-04-05 DIAGNOSIS — M25571 Pain in right ankle and joints of right foot: Secondary | ICD-10-CM

## 2021-04-05 DIAGNOSIS — I10 Essential (primary) hypertension: Secondary | ICD-10-CM

## 2021-04-05 DIAGNOSIS — Z23 Encounter for immunization: Secondary | ICD-10-CM | POA: Diagnosis not present

## 2021-04-05 DIAGNOSIS — Z800 Family history of malignant neoplasm of digestive organs: Secondary | ICD-10-CM | POA: Diagnosis not present

## 2021-04-05 MED ORDER — LISINOPRIL 10 MG PO TABS: 10.0000 mg | ORAL_TABLET | Freq: Every day | ORAL | 1 refills | Status: DC

## 2021-04-05 NOTE — Assessment & Plan Note (Signed)
Chronic.  Controlled.  Continue with current medication regimen of Lisinopril 10mg .  Refills sent.  Return to clinic in 6 months for reevaluation.  Call sooner if concerns arise.

## 2021-04-05 NOTE — Progress Notes (Signed)
BP 124/74   Pulse 72   Temp 98.8 F (37.1 C) (Oral)   Wt 225 lb 6.4 oz (102.2 kg)   SpO2 98%   BMI 35.34 kg/m    Subjective:    Patient ID: Darlene [name], female    DOB: 1976/05/18, 45 y.o.   MRN: [id number]  HPI: Darlene [name] is a 45 y.o. female  Chief Complaint  Patient presents with   Hypertension   HYPERTENSION Hypertension status: controlled  Satisfied with current treatment? no Duration of hypertension: years BP monitoring frequency:  weekly BP range: 120/70 BP medication side effects:  no Medication compliance: excellent compliance Previous BP meds:lisinopril Aspirin: no Recurrent headaches: no Visual changes: no Palpitations: no Dyspnea: no Chest pain: no Lower extremity edema: no Dizzy/lightheaded: no    Relevant past medical, surgical, family and social history reviewed and updated as indicated. Interim medical history since our last visit reviewed. Allergies and medications reviewed and updated.  Review of Systems  Per HPI unless specifically indicated above     Objective:    BP 124/74   Pulse 72   Temp 98.8 F (37.1 C) (Oral)   Wt 225 lb 6.4 oz (102.2 kg)   SpO2 98%   BMI 35.34 kg/m   Wt Readings from Last 3 Encounters:  04/05/21 225 lb 6.4 oz (102.2 kg)  01/05/21 227 lb (103 kg)  10/30/20 230 lb (104.3 kg)    Physical Exam Vitals and nursing note reviewed.  Constitutional:      General: She is not in acute distress.    Appearance: Normal appearance. She is normal weight. She is not ill-appearing, toxic-appearing or diaphoretic.  HENT:     Head: Normocephalic.     Right Ear: External ear normal.     Left Ear: External ear normal.     Nose: Nose normal.     Mouth/Throat:     Mouth: Mucous membranes are moist.     Pharynx: Oropharynx is clear.  Eyes:     General:        Right eye: No discharge.        Left eye: No discharge.     Extraocular Movements: Extraocular movements intact.     Conjunctiva/sclera:  Conjunctivae normal.     Pupils: Pupils are equal, round, and reactive to light.  Cardiovascular:     Rate and Rhythm: Normal rate and regular rhythm.     Heart sounds: No murmur heard. Pulmonary:     Effort: Pulmonary effort is normal. No respiratory distress.     Breath sounds: Normal breath sounds. No wheezing or rales.  Musculoskeletal:     Cervical back: Normal range of motion and neck supple.  Skin:    General: Skin is warm and dry.     Capillary Refill: Capillary refill takes less than 2 seconds.  Neurological:     General: No focal deficit present.     Mental Status: She is alert and oriented to person, place, and time. Mental status is at baseline.  Psychiatric:        Mood and Affect: Mood normal.        Behavior: Behavior normal.        Thought Content: Thought content normal.        Judgment: Judgment normal.    Results for orders placed or performed in visit on 04/28/20  [id number] 11+Oxyco+Alc+Crt-Bund  Result Value Ref Range   Ethanol Negative Cutoff=0.020 %   Amphetamines, Urine Negative Cutoff=1000 ng/mL   Barbiturate  Negative Cutoff=200 ng/mL   BENZODIAZ UR QL Negative Cutoff=200 ng/mL   Cannabinoid Quant, Ur Negative Cutoff=50 ng/mL   Cocaine (Metabolite) Negative Cutoff=300 ng/mL   OPIATE SCREEN URINE Negative Cutoff=300 ng/mL   Oxycodone/Oxymorphone, Urine Negative Cutoff=300 ng/mL   Phencyclidine Negative Cutoff=25 ng/mL   Methadone Screen, Urine Negative Cutoff=300 ng/mL   Propoxyphene Negative Cutoff=300 ng/mL   Meperidine Negative Cutoff=200 ng/mL   Tramadol Negative Cutoff=200 ng/mL   Creatinine 60.2 20.0 - 300.0 mg/dL   pH, Urine 5.8 4.5 - 8.9  CBC with Differential/Platelet  Result Value Ref Range   WBC 9.2 3.4 - 10.8 x10E3/uL   RBC 4.78 3.77 - 5.28 x10E6/uL   Hemoglobin 13.9 11.1 - 15.9 g/dL   Hematocrit 55.7 32.2 - 46.6 %   MCV 88 79 - 97 fL   MCH 29.1 26.6 - 33.0 pg   MCHC 33.2 31.5 - 35.7 g/dL   RDW 02.5 42.7 - 06.2 %   Platelets 297 150  - 450 x10E3/uL   Neutrophils 62 Not Estab. %   Lymphs 26 Not Estab. %   Monocytes 8 Not Estab. %   Eos 3 Not Estab. %   Basos 1 Not Estab. %   Neutrophils Absolute 5.7 1.4 - 7.0 x10E3/uL   Lymphocytes Absolute 2.4 0.7 - 3.1 x10E3/uL   Monocytes Absolute 0.7 0.1 - 0.9 x10E3/uL   EOS (ABSOLUTE) 0.3 0.0 - 0.4 x10E3/uL   Basophils Absolute 0.1 0.0 - 0.2 x10E3/uL   Immature Granulocytes 0 Not Estab. %   Immature Grans (Abs) 0.0 0.0 - 0.1 x10E3/uL  Comprehensive metabolic panel  Result Value Ref Range   Glucose 92 65 - 99 mg/dL   BUN 12 6 - 24 mg/dL   Creatinine, Ser 3.76 0.57 - 1.00 mg/dL   GFR calc non Af Amer 84 >59 mL/min/1.73   GFR calc Af Amer 96 >59 mL/min/1.73   BUN/Creatinine Ratio 14 9 - 23   Sodium 138 134 - 144 mmol/L   Potassium 3.7 3.5 - 5.2 mmol/L   Chloride 99 96 - 106 mmol/L   CO2 25 20 - 29 mmol/L   Calcium 9.4 8.7 - 10.2 mg/dL   Total Protein 7.3 6.0 - 8.5 g/dL   Albumin 4.6 3.8 - 4.8 g/dL   Globulin, Total 2.7 1.5 - 4.5 g/dL   Albumin/Globulin Ratio 1.7 1.2 - 2.2   Bilirubin Total 0.3 0.0 - 1.2 mg/dL   Alkaline Phosphatase 22 (L) 44 - 121 IU/L   AST 15 0 - 40 IU/L   ALT 14 0 - 32 IU/L  Lipid Panel w/o Chol/HDL Ratio  Result Value Ref Range   Cholesterol, Total 188 100 - 199 mg/dL   Triglycerides 84 0 - 149 mg/dL   HDL 69 >28 mg/dL   VLDL Cholesterol Cal 15 5 - 40 mg/dL   LDL Chol Calc (NIH) 315 (H) 0 - 99 mg/dL  TSH  Result Value Ref Range   TSH 1.060 0.450 - 4.500 uIU/mL      Assessment & Plan:   Problem List Items Addressed This Visit       Cardiovascular and Mediastinum   Primary hypertension - Primary    Chronic.  Controlled.  Continue with current medication regimen of Lisinopril 10mg .  Refills sent.  Return to clinic in 6 months for reevaluation.  Call sooner if concerns arise.        Relevant Medications   lisinopril (ZESTRIL) 10 MG tablet     Other   Chronic pain of  right ankle    Stable and under good control, continue current  regimen with rare prn use of tramadol for flares.  UDS obtained. Will send for patient when needed.       Other Visit Diagnoses     Family history of colon cancer in father       Relevant Orders   Ambulatory referral to Gastroenterology   Need for influenza vaccination       Relevant Orders   Flu Vaccine QUAD 20mo+IM (Fluarix, Fluzone & Alfiuria Quad PF) (Completed)        Follow up plan: Return in about 6 months (around 10/03/2021) for Physical and Fasting labs.

## 2021-04-05 NOTE — Assessment & Plan Note (Addendum)
Stable and under good control, continue current regimen with rare prn use of tramadol for flares.  UDS obtained. Will send for patient when needed.

## 2021-04-07 ENCOUNTER — Other Ambulatory Visit: Payer: Self-pay

## 2021-04-07 NOTE — Progress Notes (Signed)
Unable to contact to schedule cc screening. Letter will be mailed out.

## 2021-04-08 ENCOUNTER — Telehealth: Payer: Self-pay

## 2021-04-08 NOTE — Telephone Encounter (Signed)
Pt. Calling to schedule colonoscopy. Returning call

## 2021-04-09 ENCOUNTER — Telehealth: Payer: Self-pay

## 2021-04-09 NOTE — Telephone Encounter (Signed)
Pt. Returning call she said her phone thinks our number is spam so she missed it.

## 2021-04-09 NOTE — Telephone Encounter (Signed)
Returned patients call. LVM to call back °

## 2021-04-12 NOTE — Telephone Encounter (Signed)
Returned patients call. LVM to call office back. 

## 2021-04-14 ENCOUNTER — Telehealth: Payer: Self-pay

## 2021-04-14 NOTE — Telephone Encounter (Signed)
Pt. Returning call saying her phone sends this number straight to voicemail. She said please leave a number for her to call back to schedule her procedure.

## 2021-04-15 ENCOUNTER — Telehealth: Payer: Self-pay

## 2021-04-15 ENCOUNTER — Other Ambulatory Visit: Payer: Self-pay

## 2021-04-15 DIAGNOSIS — Z1211 Encounter for screening for malignant neoplasm of colon: Secondary | ICD-10-CM

## 2021-04-15 DIAGNOSIS — Z800 Family history of malignant neoplasm of digestive organs: Secondary | ICD-10-CM

## 2021-04-15 DIAGNOSIS — N939 Abnormal uterine and vaginal bleeding, unspecified: Secondary | ICD-10-CM | POA: Insufficient documentation

## 2021-04-15 MED ORDER — PEG 3350-KCL-NA BICARB-NACL 420 G PO SOLR: 4000.0000 mL | Freq: Once | ORAL | 0 refills | Status: AC

## 2021-04-15 NOTE — Telephone Encounter (Signed)
Returned patients call. LVM to call back °

## 2021-04-15 NOTE — Progress Notes (Signed)
Gastroenterology Pre-Procedure Review  Request Date: 06/11/2021 Requesting Physician: Dr. Maximino Greenland  PATIENT REVIEW QUESTIONS: The patient responded to the following health history questions as indicated:    1. Are you having any GI issues? no 2. Do you have a personal history of Polyps? no 3. Do you have a family history of Colon Cancer or Polyps? yes (Father- colon cancer and colon polyps) 4. Diabetes Mellitus? no 5. Joint replacements in the past 12 months?no 6. Major health problems in the past 3 months?no 7. Any artificial heart valves, MVP, or defibrillator?no    MEDICATIONS & ALLERGIES:    Patient reports the following regarding taking any anticoagulation/antiplatelet therapy:   Plavix, Coumadin, Eliquis, Xarelto, Lovenox, Pradaxa, Brilinta, or Effient? no Aspirin? no  Patient confirms/reports the following medications:  Current Outpatient Medications  Medication Sig Dispense Refill   etonogestrel (NEXPLANON) 68 MG IMPL implant 1 each by Subdermal route once.     gabapentin (NEURONTIN) 300 MG capsule TAKE 1 CAPSULE BY MOUTH THREE TIMES A DAY 270 capsule 1   lisinopril (ZESTRIL) 10 MG tablet Take 1 tablet (10 mg total) by mouth daily. 90 tablet 1   traMADol (ULTRAM) 50 MG tablet Take 2 tablets (100 mg total) by mouth every 6 (six) hours as needed. 40 tablet 0   No current facility-administered medications for this visit.    Patient confirms/reports the following allergies:  No Known Allergies  No orders of the defined types were placed in this encounter.   AUTHORIZATION INFORMATION Primary Insurance: 1D#: Group #:  Secondary Insurance: 1D#: Group #:  SCHEDULE INFORMATION: Date: 06/11/2021 Time: Location: [facility]

## 2021-05-04 ENCOUNTER — Other Ambulatory Visit: Payer: Self-pay | Admitting: Nurse Practitioner

## 2021-05-04 NOTE — Telephone Encounter (Signed)
Requested medications are due for refill today.  yes  Requested medications are on the active medications list.  yes  Last refill. 10/30/2020  Future visit scheduled.   yes  Notes to clinic.  Medication not delegated.

## 2021-05-18 NOTE — Telephone Encounter (Signed)
Nexplanon not rcvd. Not due for exchange til 08/2020

## 2021-06-11 ENCOUNTER — Ambulatory Visit: Payer: BC Managed Care – PPO | Admitting: Anesthesiology

## 2021-06-11 ENCOUNTER — Other Ambulatory Visit: Payer: Self-pay

## 2021-06-11 ENCOUNTER — Encounter
Admission: RE | Disposition: A | Discharge status: Home / Self Care | Payer: Self-pay | Source: Ambulatory Visit | Attending: Gastroenterology

## 2021-06-11 ENCOUNTER — Encounter: Payer: Self-pay | Admitting: Gastroenterology

## 2021-06-11 ENCOUNTER — Ambulatory Visit
Admission: RE | Admit: 2021-06-11 | Discharge: 2021-06-11 | Disposition: A | Discharge status: Home / Self Care | Payer: BC Managed Care – PPO | Source: Ambulatory Visit | Attending: Gastroenterology | Admitting: Gastroenterology

## 2021-06-11 DIAGNOSIS — K621 Rectal polyp: Secondary | ICD-10-CM | POA: Insufficient documentation

## 2021-06-11 DIAGNOSIS — Z1211 Encounter for screening for malignant neoplasm of colon: Secondary | ICD-10-CM | POA: Diagnosis present

## 2021-06-11 DIAGNOSIS — I10 Essential (primary) hypertension: Secondary | ICD-10-CM | POA: Diagnosis not present

## 2021-06-11 DIAGNOSIS — K648 Other hemorrhoids: Secondary | ICD-10-CM | POA: Diagnosis not present

## 2021-06-11 DIAGNOSIS — Z800 Family history of malignant neoplasm of digestive organs: Secondary | ICD-10-CM | POA: Diagnosis not present

## 2021-06-11 HISTORY — DX: Pain in unspecified ankle and joints of unspecified foot: M25.579

## 2021-06-11 HISTORY — DX: Essential (primary) hypertension: I10

## 2021-06-11 HISTORY — PX: COLONOSCOPY WITH PROPOFOL: SHX5780

## 2021-06-11 LAB — POCT PREGNANCY, URINE: Preg Test, Ur: NEGATIVE

## 2021-06-11 SURGERY — COLONOSCOPY WITH PROPOFOL
Anesthesia: General

## 2021-06-11 MED ORDER — PROPOFOL 500 MG/50ML IV EMUL
INTRAVENOUS | Status: DC | PRN
  Administered 2021-06-11: 120 ug/kg/min via INTRAVENOUS

## 2021-06-11 MED ORDER — SODIUM CHLORIDE 0.9 % IV SOLN: INTRAVENOUS | Status: DC

## 2021-06-11 MED ORDER — DEXMEDETOMIDINE HCL IN NACL 200 MCG/50ML IV SOLN
INTRAVENOUS | Status: DC | PRN
  Administered 2021-06-11: 8 ug via INTRAVENOUS
  Administered 2021-06-11: 12 ug via INTRAVENOUS

## 2021-06-11 MED ORDER — PROPOFOL 10 MG/ML IV BOLUS
INTRAVENOUS | Status: DC | PRN
  Administered 2021-06-11: 30 mg via INTRAVENOUS
  Administered 2021-06-11: 100 mg via INTRAVENOUS

## 2021-06-11 MED ORDER — PROPOFOL 10 MG/ML IV BOLUS
INTRAVENOUS | Status: AC
  Filled 2021-06-11: qty 20

## 2021-06-11 MED ORDER — LIDOCAINE HCL (CARDIAC) PF 100 MG/5ML IV SOSY
PREFILLED_SYRINGE | INTRAVENOUS | Status: DC | PRN
  Administered 2021-06-11: 50 mg via INTRAVENOUS

## 2021-06-11 MED ORDER — LIDOCAINE HCL (PF) 2 % IJ SOLN
INTRAMUSCULAR | Status: AC
  Filled 2021-06-11: qty 5

## 2021-06-11 NOTE — Transfer of Care (Signed)
Immediate Anesthesia Transfer of Care Note  Patient: Darlene [name]  Procedure(s) Performed: COLONOSCOPY WITH PROPOFOL  Patient Location: PACU and Endoscopy Unit  Anesthesia Type:General  Level of Consciousness: drowsy and patient cooperative  Airway & Oxygen Therapy: Patient Spontanous Breathing  Post-op Assessment: Report given to RN and Post -op Vital signs reviewed and stable  Post vital signs: Reviewed and stable  Last Vitals:  Vitals Value Taken Time  BP 121/89 06/11/21 0859  Temp    Pulse 76 06/11/21 0859  Resp 18 06/11/21 0859  SpO2 98 % 06/11/21 0859  Vitals shown include unvalidated device data.  Last Pain:  Vitals:   06/11/21 0858  TempSrc:   PainSc: 0-No pain         Complications: No notable events documented.

## 2021-06-11 NOTE — H&P (Signed)
Melodie Bouillon, MD [address], [place], [place], [address] [address], [place], [place], [address] Phone: [phone]  Fax: [phone]  Primary Care Physician:  Larae Grooms, NP   Pre-Procedure History & Physical: HPI:  Darlene [name] is a 45 y.o. female is here for a colonoscopy.   Past Medical History:  Diagnosis Date   Ankle pain    Hypertension     Past Surgical History:  Procedure Laterality Date   ABLATION     uterus   ACHILLES TENDON REPAIR Right 2016   ACHILLES TENDON REPAIR Right 2017    Prior to Admission medications   Medication Sig Start Date End Date Taking? Authorizing Provider  etonogestrel (NEXPLANON) 68 MG IMPL implant 1 each by Subdermal route once.   Yes [provider]  gabapentin (NEURONTIN) 300 MG capsule TAKE 1 CAPSULE BY MOUTH THREE TIMES A DAY 10/30/20  Yes Larae Grooms, NP  lisinopril (ZESTRIL) 10 MG tablet Take 1 tablet (10 mg total) by mouth daily. 04/05/21  Yes Larae Grooms, NP  traMADol (ULTRAM) 50 MG tablet TAKE 2 TABLETS BY MOUTH EVERY 6 HOURS AS NEEDED. 05/05/21  Yes Larae Grooms, NP    Allergies as of 04/15/2021   (No Known Allergies)    Family History  Problem Relation Age of Onset   Diabetes Mother    Thyroid disease Mother    Colon cancer Father    Diabetes Father    Heart disease Father    ADD / ADHD Son    Diabetes Maternal Grandmother    Breast cancer Neg Hx     Social History   Socioeconomic History   Marital status: Married    Spouse name: Not on file   Number of children: Not on file   Years of education: Not on file   Highest education level: Not on file  Occupational History   Not on file  Tobacco Use   Smoking status: Never   Smokeless tobacco: Never  Vaping Use   Vaping Use: Never used  Substance and Sexual Activity   Alcohol use: Yes    Comment: occ   Drug use: Never   Sexual activity: Yes    Partners: Male    Birth control/protection:  I.U.D., Implant  Other Topics Concern   Not on file  Social History Narrative   Patient lives with husband. Recently relocated to [facility] from [place], and prior to that lived in [place]. Patient is from [place] and her family continues to reside there.    Social Determinants of Health   Financial Resource Strain: Not on file  Food Insecurity: Not on file  Transportation Needs: Not on file  Physical Activity: Not on file  Stress: Not on file  Social Connections: Not on file  Intimate Partner Violence: Not on file    Review of Systems: See HPI, otherwise negative ROS  Physical Exam: Constitutional: General:   Alert,  Well-developed, well-nourished, pleasant and cooperative in NAD BP (!) 156/88   Pulse 84   Temp (!) 97 F (36.1 C) (Temporal)   Resp 18   Ht 5\' 7"  (1.702 m)   Wt 99.8 kg   SpO2 100%   BMI 34.46 kg/m   Head: Normocephalic, atraumatic.   Eyes:  Sclera clear, no icterus.   Conjunctiva pink.   Mouth:  No deformity or lesions, oropharynx pink & moist.  Neck:  Supple, trachea midline  Respiratory: Normal respiratory effort  Gastrointestinal:  Soft, non-tender and non-distended without masses, hepatosplenomegaly  or hernias noted.  No guarding or rebound tenderness.     Cardiac: No clubbing or edema.  No cyanosis. Normal posterior tibial pedal pulses noted.  Lymphatic:  No significant cervical adenopathy.  Psych:  Alert and cooperative. Normal mood and affect.  Musculoskeletal:   Symmetrical without gross deformities. 5/5 Lower extremity strength bilaterally.  Skin: Warm. Intact without significant lesions or rashes. No jaundice.  Neurologic:  Face symmetrical, tongue midline, Normal sensation to touch;  grossly normal neurologically.  Psych:  Alert and oriented x3, Alert and cooperative. Normal mood and affect.  Impression/Plan: Darlene [name] is here for a colonoscopy to be performed for family history of colon cancer in father  Risks, benefits, limitations,  and alternatives regarding  colonoscopy have been reviewed with the patient.  Questions have been answered.  All parties agreeable.   Pasty Spillers, MD  06/11/2021, 8:26 AM

## 2021-06-11 NOTE — Anesthesia Preprocedure Evaluation (Signed)
Anesthesia Evaluation  Patient identified by MRN, date of birth, ID band Patient awake    Reviewed: Allergy & Precautions, NPO status , Patient's Chart, lab work & pertinent test results  Airway Mallampati: III  TM Distance: >3 FB Neck ROM: full    Dental no notable dental hx.    Pulmonary neg pulmonary ROS,    Pulmonary exam normal        Cardiovascular hypertension, negative cardio ROS Normal cardiovascular exam     Neuro/Psych negative neurological ROS  negative psych ROS   GI/Hepatic negative GI ROS, Neg liver ROS,   Endo/Other  negative endocrine ROS  Renal/GU negative Renal ROS  negative genitourinary   Musculoskeletal   Abdominal (+) + obese,   Peds  Hematology negative hematology ROS (+)   Anesthesia Other Findings Past Medical History: No date: Ankle pain No date: Hypertension  Past Surgical History: No date: ABLATION     Comment:  uterus 2016: ACHILLES TENDON REPAIR; Right 2017: ACHILLES TENDON REPAIR; Right  BMI    Body Mass Index: 34.46 kg/m      Reproductive/Obstetrics negative OB ROS                             Anesthesia Physical Anesthesia Plan  ASA: 2  Anesthesia Plan: General   Post-op Pain Management:    Induction: Intravenous  PONV Risk Score and Plan: Propofol infusion and TIVA  Airway Management Planned: Natural Airway and Nasal Cannula  Additional Equipment:   Intra-op Plan:   Post-operative Plan:   Informed Consent: I have reviewed the patients History and Physical, chart, labs and discussed the procedure including the risks, benefits and alternatives for the proposed anesthesia with the patient or authorized representative who has indicated his/her understanding and acceptance.     Dental Advisory Given  Plan Discussed with: Anesthesiologist, CRNA and Surgeon  Anesthesia Plan Comments: (Patient consented for risks of anesthesia  including but not limited to:  - adverse reactions to medications - risk of airway placement if required - damage to eyes, teeth, lips or other oral mucosa - nerve damage due to positioning  - sore throat or hoarseness - Damage to heart, brain, nerves, lungs, other parts of body or loss of life  Patient voiced understanding.)        Anesthesia Quick Evaluation

## 2021-06-11 NOTE — Anesthesia Postprocedure Evaluation (Signed)
Anesthesia Post Note  Patient: Darlene [name]  Procedure(s) Performed: COLONOSCOPY WITH PROPOFOL  Patient location during evaluation: Endoscopy Anesthesia Type: General Level of consciousness: awake and alert Pain management: pain level controlled Vital Signs Assessment: post-procedure vital signs reviewed and stable Respiratory status: spontaneous breathing, nonlabored ventilation, respiratory function stable and patient connected to nasal cannula oxygen Cardiovascular status: blood pressure returned to baseline and stable Postop Assessment: no apparent nausea or vomiting Anesthetic complications: no   No notable events documented.   Last Vitals:  Vitals:   06/11/21 0908 06/11/21 0918  BP: 126/81 127/82  Pulse: 74 62  Resp: 17 15  Temp:    SpO2: 99% 100%    Last Pain:  Vitals:   06/11/21 0918  TempSrc:   PainSc: 0-No pain                 Johny Blamer

## 2021-06-11 NOTE — Op Note (Signed)
[facility] Gastroenterology Patient Name: Darlene [name] Procedure Date: 06/11/2021 8:25 AM MRN: [id number] Account #: [id number] Date of Birth: August 11, 1975 Admit Type: Outpatient Age: 45 Room: [facility] ENDO ROOM 2 Gender: Female Note Status: Finalized Instrument Name: Nelda Marseille [id number] Procedure:             Colonoscopy Indications:           Screening in patient at increased risk: Family history                         of 1st-degree relative with colorectal cancer Providers:             Anslee Micheletti B. Maximino Greenland MD, MD Referring MD:          Larae Grooms (Referring MD) Medicines:             Monitored Anesthesia Care Complications:         No immediate complications. Procedure:             Pre-Anesthesia Assessment:                        - ASA Grade Assessment: II - A patient with mild                         systemic disease.                        - Prior to the procedure, a History and Physical was                         performed, and patient medications, allergies and                         sensitivities were reviewed. The patient's tolerance                         of previous anesthesia was reviewed.                        - The risks and benefits of the procedure and the                         sedation options and risks were discussed with the                         patient. All questions were answered and informed                         consent was obtained.                        - Patient identification and proposed procedure were                         verified prior to the procedure by the physician, the                         nurse, the anesthesiologist, the anesthetist and the  technician. The procedure was verified in the                         procedure room.                        After obtaining informed consent, the colonoscope was                         passed under direct vision. Throughout the procedure,                          the patient's blood pressure, pulse, and oxygen                         saturations were monitored continuously. The                         Colonoscope was introduced through the anus and                         advanced to the the cecum, identified by appendiceal                         orifice and ileocecal valve. The colonoscopy was                         performed with ease. The patient tolerated the                         procedure well. The quality of the bowel preparation                         was fair. Findings:      The perianal and digital rectal examinations were normal.      A 4 mm polyp was found in the rectum. The polyp was sessile. The polyp       was removed with a jumbo cold forceps. Resection and retrieval were       complete.      The exam was otherwise without abnormality.      The rectum, sigmoid colon, descending colon, transverse colon, ascending       colon and cecum appeared normal.      Non-bleeding internal hemorrhoids were found during retroflexion. Impression:            - Preparation of the colon was fair.                        - One 4 mm polyp in the rectum, removed with a jumbo                         cold forceps. Resected and retrieved.                        - The examination was otherwise normal.                        - The rectum, sigmoid colon, descending colon,  transverse colon, ascending colon and cecum are normal.                        - Non-bleeding internal hemorrhoids. Recommendation:        - Discharge patient to home (with escort).                        - Advance diet as tolerated.                        - Continue present medications.                        - Await pathology results.                        - Repeat colonoscopy date to be determined after                         pending pathology results are reviewed.                        - The findings and recommendations were  discussed with                         the patient.                        - The findings and recommendations were discussed with                         the patient's family.                        - Return to primary care physician as previously                         scheduled.                        - High fiber diet. Procedure Code(s):     --- Professional ---                        [phone], Colonoscopy, flexible; with biopsy, single or                         multiple Diagnosis Code(s):     --- Professional ---                        Z80.0, Family history of malignant neoplasm of                         digestive organs                        K62.1, Rectal polyp CPT copyright 2019 American Medical Association. All rights reserved. The codes documented in this report are preliminary and upon coder review may  be revised to meet current compliance requirements.  Melodie Bouillon, MD Michel Bickers B. Maximino Greenland MD, MD 06/11/2021 9:06:03 AM This report has been signed electronically. Number of Addenda: 0 Note Initiated  On: 06/11/2021 8:25 AM Scope Withdrawal Time: 0 hours 14 minutes 9 seconds  Total Procedure Duration: 0 hours 19 minutes 28 seconds  Estimated Blood Loss:  Estimated blood loss: none.      [facility]

## 2021-06-14 ENCOUNTER — Encounter: Payer: Self-pay | Admitting: Gastroenterology

## 2021-06-14 LAB — SURGICAL PATHOLOGY

## 2021-06-23 ENCOUNTER — Encounter: Payer: Self-pay | Admitting: Gastroenterology

## 2021-07-09 ENCOUNTER — Other Ambulatory Visit: Payer: Self-pay | Admitting: Nurse Practitioner

## 2021-07-10 NOTE — Telephone Encounter (Signed)
Requested Prescriptions  Pending Prescriptions Disp Refills   gabapentin (NEURONTIN) 300 MG capsule [Pharmacy Med Name: GABAPENTIN 300 MG CAPSULE] 270 capsule 1    Sig: TAKE 1 CAPSULE BY MOUTH THREE TIMES A DAY     Neurology: Anticonvulsants - gabapentin Passed - 07/09/2021 11:48 AM      Passed - Valid encounter within last 12 months    Recent Outpatient Visits          3 months ago Primary hypertension   [facility] Larae Grooms, NP   6 months ago Primary hypertension   [facility] Larae Grooms, NP   8 months ago Chronic pain of right ankle   [facility] Larae Grooms, NP   1 year ago Annual physical exam   [facility] Marjie Skiff, NP   1 year ago Poison ivy dermatitis   [facility] Marjie Skiff, NP      Future Appointments            In 2 months Larae Grooms, NP [facility]

## 2021-10-04 ENCOUNTER — Encounter: Payer: BC Managed Care – PPO | Admitting: Nurse Practitioner

## 2021-10-26 NOTE — Progress Notes (Signed)
? ?BP 135/87   Pulse 78   Temp 98.5 ?F (36.9 ?C) (Oral)   Ht 5\' 8"  (1.727 m)   Wt 230 lb 12.8 oz (104.7 kg)   SpO2 99%   BMI 35.09 kg/m?   ? ?Subjective:  ? ? Patient ID: Darlene [name], female    DOB: 28-Oct-1975, 46 y.o.   MRN: [id number] ? ?HPI: ?Darlene [name] is a 46 y.o. female presenting on 10/27/2021 for comprehensive medical examination. Current medical complaints include: was not taking the Lisinopril ? ?She currently lives with: ?Menopausal Symptoms: no ? ?HYPERTENSION ?Hypertension status: uncontrolled  ?Satisfied with current treatment? no ?Duration of hypertension: years ?BP monitoring frequency:  not checking ?BP range:  ?BP medication side effects:  no ?Medication compliance: poor compliance ?Previous BP meds:lisinopril ?Aspirin: no ?Recurrent headaches: no ?Visual changes: no ?Palpitations: no ?Dyspnea: no ?Chest pain: no ?Lower extremity edema: no ?Dizzy/lightheaded: no ? ?Depression Screen done today and results listed below:  ? ?  10/27/2021  ?  9:07 AM 04/28/2020  ?  3:51 PM 06/07/2019  ?  1:01 PM 05/31/2018  ?  9:18 AM 03/30/2018  ? 11:02 AM  ?Depression screen PHQ 2/9  ?Decreased Interest 0 0 0 0 0  ?Down, Depressed, Hopeless 0 0 0 0 0  ?PHQ - 2 Score 0 0 0 0 0  ?Altered sleeping 0   1 1  ?Tired, decreased energy 0   1   ?Change in appetite 0   0 0  ?Feeling bad or failure about yourself  0   0 0  ?Trouble concentrating 0   0 0  ?Moving slowly or fidgety/restless 0   0 0  ?Suicidal thoughts 0   0 0  ?PHQ-9 Score 0   2 1  ?Difficult doing work/chores Not difficult at all      ? ? ?The patient does not have a history of falls. I did complete a risk assessment for falls. A plan of care for falls was documented. ? ? ?Past Medical History:  ?Past Medical History:  ?Diagnosis Date  ? Ankle pain   ? Hypertension   ? ? ?Surgical History:  ?Past Surgical History:  ?Procedure Laterality Date  ? ABLATION    ? uterus  ? ACHILLES TENDON REPAIR Right 2016  ? ACHILLES TENDON REPAIR Right 2017  ?  COLONOSCOPY WITH PROPOFOL N/A 06/11/2021  ? Procedure: COLONOSCOPY WITH PROPOFOL;  Surgeon: 06/13/2021, MD;  Location: [facility] ENDOSCOPY;  Service: Endoscopy;  Laterality: N/A;  ? ? ?Medications:  ?Current Outpatient Medications on File Prior to Visit  ?Medication Sig  ? etonogestrel (NEXPLANON) 68 MG IMPL implant 1 each by Subdermal route once.  ? gabapentin (NEURONTIN) 300 MG capsule TAKE 1 CAPSULE BY MOUTH THREE TIMES A DAY  ? ?No current facility-administered medications on file prior to visit.  ? ? ?Allergies:  ?No Known Allergies ? ?Social History:  ?Social History  ? ?Socioeconomic History  ? Marital status: Married  ?  Spouse name: Not on file  ? Number of children: Not on file  ? Years of education: Not on file  ? Highest education level: Not on file  ?Occupational History  ? Not on file  ?Tobacco Use  ? Smoking status: Never  ? Smokeless tobacco: Never  ?Vaping Use  ? Vaping Use: Never used  ?Substance and Sexual Activity  ? Alcohol use: Yes  ?  Comment: occ  ? Drug use: Never  ? Sexual activity: Yes  ?  Partners: Male  ?  Birth control/protection: I.U.D., Implant  ?Other Topics Concern  ? Not on file  ?Social History Narrative  ? Patient lives with husband. [place], and prior to [place] lived in [place]. Patient is from [place] and her family continues to reside there.   ? ?Social Determinants of Health  ? ?Financial Resource Strain: Not on file  ?Food Insecurity: Not on file  ?Transportation Needs: Not on file  ?Physical Activity: Not on file  ?Stress: Not on file  ?Social Connections: Not on file  ?Intimate Partner Violence: Not on file  ? ?Social History  ? ?Tobacco Use  ?Smoking Status Never  ?Smokeless Tobacco Never  ? ?Social History  ? ?Substance and Sexual Activity  ?Alcohol Use Yes  ? Comment: occ  ? ? ?Family History:  ?Family History  ?Problem Relation Age of Onset  ? Diabetes Mother   ? Thyroid disease Mother   ? Colon cancer Father   ? Diabetes Father   ? Heart  disease Father   ? ADD / ADHD Son   ? Diabetes Maternal Grandmother   ? Breast cancer Neg Hx   ? ? ?Past medical history, surgical history, medications, allergies, family history and social history reviewed with patient today and changes made to appropriate areas of the chart.  ? ?Review of Systems  ?Eyes:  Negative for blurred vision and double vision.  ?Respiratory:  Negative for shortness of breath.   ?Cardiovascular:  Negative for chest pain, palpitations and leg swelling.  ?Neurological:  Negative for dizziness and headaches.  ?All other ROS negative except what is listed above and in the HPI.  ? ?   ?Objective:  ?  ?BP 135/87   Pulse 78   Temp 98.5 ?F (36.9 ?C) (Oral)   Ht 5\' 8"  (1.727 m)   Wt 230 lb 12.8 oz (104.7 kg)   SpO2 99%   BMI 35.09 kg/m?   ?Wt Readings from Last 3 Encounters:  ?10/27/21 230 lb 12.8 oz (104.7 kg)  ?06/11/21 220 lb (99.8 kg)  ?04/05/21 225 lb 6.4 oz (102.2 kg)  ?  ?Physical Exam ?Vitals and nursing note reviewed.  ?Constitutional:   ?   General: She is awake. She is not in acute distress. ?   Appearance: Normal appearance. She is well-developed. She is obese. She is not ill-appearing.  ?HENT:  ?   Head: Normocephalic and atraumatic.  ?   Right Ear: Hearing, tympanic membrane, ear canal and external ear normal. No drainage.  ?   Left Ear: Hearing, tympanic membrane, ear canal and external ear normal. No drainage.  ?   Nose: Nose normal.  ?   Right Sinus: No maxillary sinus tenderness or frontal sinus tenderness.  ?   Left Sinus: No maxillary sinus tenderness or frontal sinus tenderness.  ?   Mouth/Throat:  ?   Mouth: Mucous membranes are moist.  ?   Pharynx: Oropharynx is clear. Uvula midline. No pharyngeal swelling, oropharyngeal exudate or posterior oropharyngeal erythema.  ?Eyes:  ?   General: Lids are normal.     ?   Right eye: No discharge.     ?   Left eye: No discharge.  ?   Extraocular Movements: Extraocular movements intact.  ?   Conjunctiva/sclera: Conjunctivae normal.   ?   Pupils: Pupils are equal, round, and reactive to light.  ?   Visual Fields: Right eye visual fields normal and left eye visual fields normal.  ?Neck:  ?   Thyroid: No  thyromegaly.  ?   Vascular: No carotid bruit.  ?   Trachea: Trachea normal.  ?Cardiovascular:  ?   Rate and Rhythm: Normal rate and regular rhythm.  ?   Heart sounds: Normal heart sounds. No murmur heard. ?  No gallop.  ?Pulmonary:  ?   Effort: Pulmonary effort is normal. No accessory muscle usage or respiratory distress.  ?   Breath sounds: Normal breath sounds.  ?Chest:  ?Breasts: ?   Right: Normal.  ?   Left: Normal.  ?Abdominal:  ?   General: Bowel sounds are normal.  ?   Palpations: Abdomen is soft. There is no hepatomegaly or splenomegaly.  ?   Tenderness: There is no abdominal tenderness.  ?Musculoskeletal:     ?   General: Normal range of motion.  ?   Cervical back: Normal range of motion and neck supple.  ?   Right lower leg: No edema.  ?   Left lower leg: No edema.  ?Lymphadenopathy:  ?   Head:  ?   Right side of head: No submental, submandibular, tonsillar, preauricular or posterior auricular adenopathy.  ?   Left side of head: No submental, submandibular, tonsillar, preauricular or posterior auricular adenopathy.  ?   Cervical: No cervical adenopathy.  ?   Upper Body:  ?   Right upper body: No supraclavicular, axillary or pectoral adenopathy.  ?   Left upper body: No supraclavicular, axillary or pectoral adenopathy.  ?Skin: ?   General: Skin is warm and dry.  ?   Capillary Refill: Capillary refill takes less than 2 seconds.  ?   Findings: No rash.  ?Neurological:  ?   Mental Status: She is alert and oriented to person, place, and time.  ?   Gait: Gait is intact.  ?   Deep Tendon Reflexes: Reflexes are normal and symmetric.  ?   Reflex Scores: ?     Brachioradialis reflexes are 2+ on the right side and 2+ on the left side. ?     Patellar reflexes are 2+ on the right side and 2+ on the left side. ?Psychiatric:     ?   Attention and  Perception: Attention normal.     ?   Mood and Affect: Mood normal.     ?   Speech: Speech normal.     ?   Behavior: Behavior normal. Behavior is cooperative.     ?   Thought Content: Thought content normal.     ?   Judgment: Judgmen

## 2021-10-27 ENCOUNTER — Encounter: Payer: Self-pay | Admitting: Nurse Practitioner

## 2021-10-27 ENCOUNTER — Ambulatory Visit (INDEPENDENT_AMBULATORY_CARE_PROVIDER_SITE_OTHER): Payer: BC Managed Care – PPO | Admitting: Nurse Practitioner

## 2021-10-27 VITALS — BP 135/87 | HR 78 | Temp 98.5°F | Ht 68.0 in | Wt 230.8 lb

## 2021-10-27 DIAGNOSIS — Z136 Encounter for screening for cardiovascular disorders: Secondary | ICD-10-CM

## 2021-10-27 DIAGNOSIS — Z1231 Encounter for screening mammogram for malignant neoplasm of breast: Secondary | ICD-10-CM | POA: Diagnosis not present

## 2021-10-27 DIAGNOSIS — Z0000 Encounter for general adult medical examination without abnormal findings: Secondary | ICD-10-CM

## 2021-10-27 DIAGNOSIS — Z1159 Encounter for screening for other viral diseases: Secondary | ICD-10-CM

## 2021-10-27 DIAGNOSIS — I10 Essential (primary) hypertension: Secondary | ICD-10-CM

## 2021-10-27 LAB — URINALYSIS, ROUTINE W REFLEX MICROSCOPIC
Bilirubin, UA: NEGATIVE
Glucose, UA: NEGATIVE
Ketones, UA: NEGATIVE
Leukocytes,UA: NEGATIVE
Nitrite, UA: NEGATIVE
Protein,UA: NEGATIVE
Specific Gravity, UA: 1.025 (ref 1.005–1.030)
Urobilinogen, Ur: 0.2 mg/dL (ref 0.2–1.0)
pH, UA: 5.5 (ref 5.0–7.5)

## 2021-10-27 LAB — MICROSCOPIC EXAMINATION: WBC, UA: NONE SEEN /hpf (ref 0–5)

## 2021-10-27 MED ORDER — LISINOPRIL 10 MG PO TABS: 10.0000 mg | ORAL_TABLET | Freq: Every day | ORAL | 1 refills | Status: DC

## 2021-10-27 MED ORDER — TRAMADOL HCL 50 MG PO TABS: 100.0000 mg | ORAL_TABLET | Freq: Four times a day (QID) | ORAL | 0 refills | Status: DC | PRN

## 2021-10-27 NOTE — Assessment & Plan Note (Signed)
Chronic. Elevated in the 130/80s.  Patient had not started the Lisinopril 10mg  and thought she didn't need it.  States she sill start it now.  Side effects and benefits of medication discussed during visit.  Labs ordered today. Follow up in 6 months for reevaluation. ?

## 2021-10-28 LAB — COMPREHENSIVE METABOLIC PANEL
ALT: 17 IU/L (ref 0–32)
AST: 20 IU/L (ref 0–40)
Albumin/Globulin Ratio: 1.6 (ref 1.2–2.2)
Albumin: 4.4 g/dL (ref 3.8–4.8)
Alkaline Phosphatase: 18 IU/L — ABNORMAL LOW (ref 44–121)
BUN/Creatinine Ratio: 14 (ref 9–23)
BUN: 12 mg/dL (ref 6–24)
Bilirubin Total: 0.5 mg/dL (ref 0.0–1.2)
CO2: 23 mmol/L (ref 20–29)
Calcium: 9.3 mg/dL (ref 8.7–10.2)
Chloride: 102 mmol/L (ref 96–106)
Creatinine, Ser: 0.83 mg/dL (ref 0.57–1.00)
Globulin, Total: 2.7 g/dL (ref 1.5–4.5)
Glucose: 91 mg/dL (ref 70–99)
Potassium: 4 mmol/L (ref 3.5–5.2)
Sodium: 142 mmol/L (ref 134–144)
Total Protein: 7.1 g/dL (ref 6.0–8.5)
eGFR: 87 mL/min/{1.73_m2} (ref >59)

## 2021-10-28 LAB — CBC WITH DIFFERENTIAL/PLATELET
Basophils Absolute: 0.1 10*3/uL (ref 0.0–0.2)
Basos: 1 %
EOS (ABSOLUTE): 0.2 10*3/uL (ref 0.0–0.4)
Eos: 3 %
Hematocrit: 43.4 % (ref 34.0–46.6)
Hemoglobin: 14.1 g/dL (ref 11.1–15.9)
Immature Grans (Abs): 0 10*3/uL (ref 0.0–0.1)
Immature Granulocytes: 0 %
Lymphocytes Absolute: 1.7 10*3/uL (ref 0.7–3.1)
Lymphs: 25 %
MCH: 29.1 pg (ref 26.6–33.0)
MCHC: 32.5 g/dL (ref 31.5–35.7)
MCV: 90 fL (ref 79–97)
Monocytes Absolute: 0.5 10*3/uL (ref 0.1–0.9)
Monocytes: 8 %
Neutrophils Absolute: 4.3 10*3/uL (ref 1.4–7.0)
Neutrophils: 63 %
Platelets: 280 10*3/uL (ref 150–450)
RBC: 4.85 x10E6/uL (ref 3.77–5.28)
RDW: 12 % (ref 11.7–15.4)
WBC: 6.8 10*3/uL (ref 3.4–10.8)

## 2021-10-28 LAB — LIPID PANEL
Chol/HDL Ratio: 3.7 ratio (ref 0.0–4.4)
Cholesterol, Total: 194 mg/dL (ref 100–199)
HDL: 53 mg/dL (ref >39)
LDL Chol Calc (NIH): 128 mg/dL — ABNORMAL HIGH (ref 0–99)
Triglycerides: 68 mg/dL (ref 0–149)
VLDL Cholesterol Cal: 13 mg/dL (ref 5–40)

## 2021-10-28 LAB — HEPATITIS C ANTIBODY: Hep C Virus Ab: NONREACTIVE

## 2021-10-28 LAB — TSH: TSH: 1.08 u[IU]/mL (ref 0.450–4.500)

## 2021-10-28 NOTE — Progress Notes (Signed)
Hi Darlene [name].  Your lab work looks good.  Your cholesterol was slightly elevated. I recommend a low fat diet and exercise as you are able to tolerate it. Otherwise, blood work looks good.  No concerns at this time.  Follow up as discussed.

## 2021-11-09 ENCOUNTER — Encounter: Payer: Self-pay | Admitting: Nurse Practitioner

## 2021-11-15 ENCOUNTER — Ambulatory Visit (INDEPENDENT_AMBULATORY_CARE_PROVIDER_SITE_OTHER): Payer: BC Managed Care – PPO | Admitting: Nurse Practitioner

## 2021-11-15 ENCOUNTER — Encounter: Payer: Self-pay | Admitting: Nurse Practitioner

## 2021-11-15 VITALS — BP 133/89 | HR 68 | Temp 98.6°F | Wt 229.2 lb

## 2021-11-15 DIAGNOSIS — I10 Essential (primary) hypertension: Secondary | ICD-10-CM | POA: Diagnosis not present

## 2021-11-15 MED ORDER — VALSARTAN 40 MG PO TABS: 40.0000 mg | ORAL_TABLET | Freq: Every day | ORAL | 1 refills | Status: DC

## 2021-11-15 NOTE — Progress Notes (Signed)
? ?BP 133/89   Pulse 68   Temp 98.6 ?F (37 ?C) (Oral)   Wt 229 lb 3.2 oz (104 kg)   SpO2 98%   BMI 34.85 kg/m?   ? ?Subjective:  ? ? Patient ID: Darlene [name], female    DOB: 18-Nov-1975, 46 y.o.   MRN: [id number] ? ?HPI: ?Darlene [name] is a 46 y.o. female ? ?Chief Complaint  ?Patient presents with  ? Med Change Request  ?  Pt reports the BP meds she has now gives her a dry cough   ? ?HYPERTENSION ?Hypertension status: controlled  ?Satisfied with current treatment? no ?Duration of hypertension: years ?BP monitoring frequency:  not checking ?BP range:  ?BP medication side effects:  no ?Medication compliance: excellent compliance ?Previous BP meds:lisinopril ?Aspirin: no ?Recurrent headaches: no ?Visual changes: no ?Palpitations: no ?Dyspnea: no ?Chest pain: no ?Lower extremity edema: no ?Dizzy/lightheaded: no ? ?Relevant past medical, surgical, family and social history reviewed and updated as indicated. Interim medical history since our last visit reviewed. ?Allergies and medications reviewed and updated. ? ?Review of Systems  ?Eyes:  Negative for visual disturbance.  ?Respiratory:  Negative for cough, chest tightness and shortness of breath.   ?Cardiovascular:  Negative for chest pain, palpitations and leg swelling.  ?Neurological:  Negative for dizziness and headaches.  ? ?Per HPI unless specifically indicated above ? ?   ?Objective:  ?  ?BP 133/89   Pulse 68   Temp 98.6 ?F (37 ?C) (Oral)   Wt 229 lb 3.2 oz (104 kg)   SpO2 98%   BMI 34.85 kg/m?   ?Wt Readings from Last 3 Encounters:  ?11/15/21 229 lb 3.2 oz (104 kg)  ?10/27/21 230 lb 12.8 oz (104.7 kg)  ?06/11/21 220 lb (99.8 kg)  ?  ?Physical Exam ?Vitals and nursing note reviewed.  ?Constitutional:   ?   General: She is not in acute distress. ?   Appearance: Normal appearance. She is normal weight. She is not ill-appearing, toxic-appearing or diaphoretic.  ?HENT:  ?   Head: Normocephalic.  ?   Right Ear: External ear normal.  ?   Left Ear:  External ear normal.  ?   Nose: Nose normal.  ?   Mouth/Throat:  ?   Mouth: Mucous membranes are moist.  ?   Pharynx: Oropharynx is clear.  ?Eyes:  ?   General:     ?   Right eye: No discharge.     ?   Left eye: No discharge.  ?   Extraocular Movements: Extraocular movements intact.  ?   Conjunctiva/sclera: Conjunctivae normal.  ?   Pupils: Pupils are equal, round, and reactive to light.  ?Cardiovascular:  ?   Rate and Rhythm: Normal rate and regular rhythm.  ?   Heart sounds: No murmur heard. ?Pulmonary:  ?   Effort: Pulmonary effort is normal. No respiratory distress.  ?   Breath sounds: Normal breath sounds. No wheezing or rales.  ?Musculoskeletal:  ?   Cervical back: Normal range of motion and neck supple.  ?Skin: ?   General: Skin is warm and dry.  ?   Capillary Refill: Capillary refill takes less than 2 seconds.  ?Neurological:  ?   General: No focal deficit present.  ?   Mental Status: She is alert and oriented to person, place, and time. Mental status is at baseline.  ?Psychiatric:     ?   Mood and Affect: Mood normal.     ?   Behavior: Behavior  normal.     ?   Thought Content: Thought content normal.     ?   Judgment: Judgment normal.  ? ? ?Results for orders placed or performed in visit on 10/27/21  ?HM PAP SMEAR  ?Result Value Ref Range  ? HM Pap smear See results scanned into chart   ?Results Console HPV  ?Result Value Ref Range  ? CHL HPV Negative   ? ?   ?Assessment & Plan:  ? ?Problem List Items Addressed This Visit   ? ?  ? Cardiovascular and Mediastinum  ? Primary hypertension - Primary  ?  Chronic.  Controlled.  Having a dry hacking cough due to Lisinopril.  Will change to Valsartan. Side effects and benefits of medication discussed during visit.  Return to clinic in 6 months for reevaluation.  Call sooner if concerns arise.  ? ? ?  ?  ? Relevant Medications  ? valsartan (DIOVAN) 40 MG tablet  ?  ? ?Follow up plan: ?Return if symptoms worsen or fail to improve. ? ? ? ? ? ?

## 2021-11-15 NOTE — Assessment & Plan Note (Signed)
Chronic.  Controlled.  Having a dry hacking cough due to Lisinopril.  Will change to Valsartan. Side effects and benefits of medication discussed during visit.  Return to clinic in 6 months for reevaluation.  Call sooner if concerns arise.  ? ?

## 2021-12-20 ENCOUNTER — Other Ambulatory Visit: Payer: Self-pay

## 2021-12-20 ENCOUNTER — Other Ambulatory Visit: Payer: Self-pay | Admitting: Nurse Practitioner

## 2021-12-20 ENCOUNTER — Emergency Department
Admission: EM | Admit: 2021-12-20 | Discharge: 2021-12-21 | Disposition: A | Discharge status: Home / Self Care | Payer: BC Managed Care – PPO | Attending: Emergency Medicine | Admitting: Emergency Medicine

## 2021-12-20 ENCOUNTER — Encounter: Payer: Self-pay | Admitting: Emergency Medicine

## 2021-12-20 DIAGNOSIS — T426X5A Adverse effect of other antiepileptic and sedative-hypnotic drugs, initial encounter: Secondary | ICD-10-CM | POA: Diagnosis not present

## 2021-12-20 DIAGNOSIS — T887XXA Unspecified adverse effect of drug or medicament, initial encounter: Secondary | ICD-10-CM | POA: Insufficient documentation

## 2021-12-20 DIAGNOSIS — F410 Panic disorder [episodic paroxysmal anxiety] without agoraphobia: Secondary | ICD-10-CM | POA: Insufficient documentation

## 2021-12-20 MED ORDER — LORAZEPAM 2 MG/ML IJ SOLN
0.5000 mg | Freq: Once | INTRAMUSCULAR | Status: AC
Start: 2021-12-21 — End: 2021-12-21
  Administered 2021-12-21: 0.5 mg via INTRAMUSCULAR
  Filled 2021-12-20: qty 1

## 2021-12-20 MED ORDER — GABAPENTIN 300 MG PO CAPS
900.0000 mg | ORAL_CAPSULE | Freq: Once | ORAL | Status: AC
  Administered 2021-12-21: 900 mg via ORAL
  Filled 2021-12-20: qty 3

## 2021-12-20 NOTE — ED Triage Notes (Signed)
Patient ambulatory to triage with steady gait, without difficulty or distress noted; pt reports she is here for a panic attack; st since Tues having anxiety, "dry mouth" and feelings of panic

## 2021-12-20 NOTE — ED Provider Notes (Signed)
[facility] Provider Note    Event Date/Time   First MD Initiated Contact with Patient 12/20/21 2333     (approximate)   History   Panic Attack   HPI  Darlene [name] is a 46 y.o. female who presents to the ED for evaluation of Panic Attack   Patient presents to the ED for evaluation of 1 week of nightly anxiety and panic attacks.  She reports she has never felt this before.  She reports that she takes 900 mg of gabapentin every night for restless leg syndrome.  She recently went on an international trip with her family.  Her husband and parents traveled around with her in [place].  She had to translate between her English-speaking husband and [place] speaking parents and reports a very "stressful" trip and "it was not vacation."  Starting 1 week ago while she was there, she reports developing significant anxiety at night, inability to sleep.  Sensation of doom and a globus sensation in her throat.  Improved during the day when she was active and felt at baseline.  They just got home this evening and she reports inability to sleep and she cannot tolerate anymore so she presents to the ED for the evaluation. Does report that she did not have her nightly gabapentin during this trip.  She denies suicidality or AV hallucinations.   Physical Exam   Triage Vital Signs: ED Triage Vitals  Enc Vitals Group     BP 12/20/21 2330 (!) 164/100     Pulse Rate 12/20/21 2330 84     Resp 12/20/21 2330 18     Temp 12/20/21 2330 98.5 F (36.9 C)     Temp Source 12/20/21 2330 Oral     SpO2 12/20/21 2330 99 %     Weight 12/20/21 2330 225 lb (102.1 kg)     Height 12/20/21 2330 5\' 7"  (1.702 m)     Head Circumference --      Peak Flow --      Pain Score 12/20/21 2329 0     Pain Loc --      Pain Edu? --      Excl. in GC? --     Most recent vital signs: Vitals:   12/20/21 2330 12/21/21 0114  BP: (!) 164/102 (!) 141/63  Pulse: 85 77  Resp: (!) 21 15  Temp: 98.5  F (36.9 C) 98 F (36.7 C)  SpO2: 100% 99%    General: Awake.  Obese.  Pleasant and conversational.  Does seem anxious.  Has linear thought processes.  Pacing in the corner around her hallway bed. CV:  Good peripheral perfusion.  Resp:  Normal effort.  Abd:  No distention.  MSK:  No deformity noted.  Neuro:  No focal deficits appreciated. Other:     ED Results / Procedures / Treatments   Labs (all labs ordered are listed, but only abnormal results are displayed) Labs Reviewed - No data to display  EKG   RADIOLOGY   Official radiology report(s): No results found.  PROCEDURES and INTERVENTIONS:  Procedures  Medications  gabapentin (NEURONTIN) capsule 900 mg (900 mg Oral Given 12/21/21 0009)  LORazepam (ATIVAN) injection 0.5 mg (0.5 mg Intramuscular Given 12/21/21 0010)     IMPRESSION / MDM / ASSESSMENT AND PLAN / ED COURSE  I reviewed the triage vital signs and the nursing notes.  Differential diagnosis includes, but is not limited to, sympathomimetic toxidrome, acute psychoses, gabapentin withdrawals, panic attack.  {Patient presents  with symptoms of an acute illness or injury that is potentially life-threatening.  46 year old female presents to the ED with a week of nightly panic attacks, likely associated with her lack of gabapentin in the setting of a stressful trip.  She look systemically well to me, has linear thought processes and I see no evidence of psychiatric emergency to require IVC or emergent psychiatric evaluation.  We will provide her typical gabapentin as well as a small dose of intramuscular Ativan and reassess.  Clinical Course as of 12/21/21 0217  Tue Dec 21, 2021  0046 Reassessed.  Feeling better.  Provided her with some water. [DS]  0110 Reassessed.  Patient reports feeling better and requesting discharge.  We discussed gabapentin at home, return precautions [DS]    Clinical Course User Index [DS] Delton Prairie, MD     FINAL CLINICAL  IMPRESSION(S) / ED DIAGNOSES   Final diagnoses:  Gabapentin adverse reaction, initial encounter  Panic attack     Rx / DC Orders   ED Discharge Orders     None        Note:  This document was prepared using Dragon voice recognition software and may include unintentional dictation errors.   Delton Prairie, MD 12/21/21 [phone]

## 2021-12-21 ENCOUNTER — Ambulatory Visit (INDEPENDENT_AMBULATORY_CARE_PROVIDER_SITE_OTHER): Payer: BC Managed Care – PPO | Admitting: Nurse Practitioner

## 2021-12-21 ENCOUNTER — Encounter: Payer: Self-pay | Admitting: Nurse Practitioner

## 2021-12-21 DIAGNOSIS — F419 Anxiety disorder, unspecified: Secondary | ICD-10-CM

## 2021-12-21 MED ORDER — GABAPENTIN 300 MG PO CAPS: ORAL_CAPSULE | ORAL | 1 refills | Status: DC

## 2021-12-21 MED ORDER — LORAZEPAM 0.5 MG PO TABS: 0.5000 mg | ORAL_TABLET | Freq: Two times a day (BID) | ORAL | 0 refills | Status: DC | PRN

## 2021-12-21 NOTE — Progress Notes (Signed)
BP 136/87   Pulse 74   Temp 98.4 F (36.9 C) (Oral)   Wt 227 lb (103 kg)   SpO2 98%   BMI 35.55 kg/m    Subjective:    Patient ID: Darlene [name], female    DOB: 1975/10/19, 46 y.o.   MRN: [id number]  HPI: Darlene [name] is a 46 y.o. female  Chief Complaint  Patient presents with   Panic Attack    Pt states she was on vacation last week and began having panic attacks mid-vacation. Pt reports ED told her she did not take enough gabapentin that she is used to which could trigger her panic attacks. Pt states she feels better but has a constant urge to swallow and feels like she will choke-although she knows she will not.     PANIC ATTACK Pt states she was on vacation last week and began having panic attacks mid-vacation. Pt reports ED told her she did not take enough gabapentin that she is used to which could trigger her panic attacks. Pt states she feels better but has a constant urge to swallow and feels like she will choke-although she knows she will not. Patient states she feels like she is losing her mind and just wants it to stop.  She was given Ativan in the ED which calmed her down.     Relevant past medical, surgical, family and social history reviewed and updated as indicated. Interim medical history since our last visit reviewed. Allergies and medications reviewed and updated.  Review of Systems  Psychiatric/Behavioral:  Positive for sleep disturbance. The patient is nervous/anxious.    Per HPI unless specifically indicated above     Objective:    BP 136/87   Pulse 74   Temp 98.4 F (36.9 C) (Oral)   Wt 227 lb (103 kg)   SpO2 98%   BMI 35.55 kg/m   Wt Readings from Last 3 Encounters:  12/21/21 227 lb (103 kg)  12/20/21 225 lb (102.1 kg)  11/15/21 229 lb 3.2 oz (104 kg)    Physical Exam Vitals and nursing note reviewed.  Constitutional:      General: She is not in acute distress.    Appearance: Normal appearance. She is obese. She is not  ill-appearing, toxic-appearing or diaphoretic.  HENT:     Head: Normocephalic.     Right Ear: External ear normal.     Left Ear: External ear normal.     Nose: Nose normal.     Mouth/Throat:     Mouth: Mucous membranes are moist.     Pharynx: Oropharynx is clear.  Eyes:     General:        Right eye: No discharge.        Left eye: No discharge.     Extraocular Movements: Extraocular movements intact.     Conjunctiva/sclera: Conjunctivae normal.     Pupils: Pupils are equal, round, and reactive to light.  Cardiovascular:     Rate and Rhythm: Normal rate and regular rhythm.     Heart sounds: No murmur heard. Pulmonary:     Effort: Pulmonary effort is normal. No respiratory distress.     Breath sounds: Normal breath sounds. No wheezing or rales.  Musculoskeletal:     Cervical back: Normal range of motion and neck supple.  Skin:    General: Skin is warm and dry.     Capillary Refill: Capillary refill takes less than 2 seconds.  Neurological:     General: No  focal deficit present.     Mental Status: She is alert and oriented to person, place, and time. Mental status is at baseline.  Psychiatric:        Mood and Affect: Mood normal.        Behavior: Behavior normal.        Thought Content: Thought content normal.        Judgment: Judgment normal.    Results for orders placed or performed in visit on 10/27/21  HM PAP SMEAR  Result Value Ref Range   HM Pap smear See results scanned into chart   Results Console HPV  Result Value Ref Range   CHL HPV Negative       Assessment & Plan:   Problem List Items Addressed This Visit       Other   Anxiety    Likely related with withdrawal from Gabapentin.  Was given Gabapentin in ER last night and symptoms improved.  However, they are still ongoing.  Recommend she take the Gabapentin TID instead of all 900mg  at bedtime.  Will also give Ativan 14 tabs to use PRN for anxiety. Recommend going for a walk and resting today.  Patient has  had minimal sleep in the last week. Follow up in 2 days for reevaluation.       Relevant Medications   LORazepam (ATIVAN) 0.5 MG tablet     Follow up plan: Return in about 2 days (around 12/23/2021) for Depression/Anxiety FU.

## 2021-12-21 NOTE — Assessment & Plan Note (Signed)
Likely related with withdrawal from Gabapentin.  Was given Gabapentin in ER last night and symptoms improved.  However, they are still ongoing.  Recommend she take the Gabapentin TID instead of all 900mg  at bedtime.  Will also give Ativan 14 tabs to use PRN for anxiety. Recommend going for a walk and resting today.  Patient has had minimal sleep in the last week. Follow up in 2 days for reevaluation.

## 2021-12-21 NOTE — ED Notes (Signed)
Pt DC to home. DC instructions reviewed, pt verbalizes understanding. Pt ambulatory out of dept with steady gait

## 2021-12-22 NOTE — Telephone Encounter (Signed)
Medication was refilled 12/21/21 by PCP. Will refuse this duplicate request.  Requested Prescriptions  Pending Prescriptions Disp Refills  . gabapentin (NEURONTIN) 300 MG capsule [Pharmacy Med Name: GABAPENTIN 300 MG CAPSULE] 270 capsule 1    Sig: TAKE 1 CAPSULE BY MOUTH THREE TIMES A DAY     Neurology: Anticonvulsants - gabapentin Passed - 12/20/2021 12:07 PM      Passed - Cr in normal range and within 360 days    Creatinine  Date Value Ref Range Status  04/28/2020 60.2 20.0 - 300.0 mg/dL Final   Creatinine, Ser  Date Value Ref Range Status  10/27/2021 0.83 0.57 - 1.00 mg/dL Final         Passed - Completed PHQ-2 or PHQ-9 in the last 360 days      Passed - Valid encounter within last 12 months    Recent Outpatient Visits          Yesterday [place], Karen, NP   1 month ago Primary hypertension   [facility] Jon Billings, NP   1 month ago Annual physical exam   [facility] Jon Billings, NP   8 months ago Primary hypertension   [facility] Jon Billings, NP   11 months ago Primary hypertension   [facility] Jon Billings, NP      Future Appointments            Tomorrow Jon Billings, NP [facility], [place]   In 4 months Jon Billings, NP [facility], [place]

## 2021-12-22 NOTE — Progress Notes (Unsigned)
There were no vitals taken for this visit.   Subjective:    Patient ID: Darlene [name], female    DOB: 29-Nov-1975, 46 y.o.   MRN: [id number]  HPI: Darlene [name] is a 46 y.o. female  No chief complaint on file.   PANIC ATTACK Pt states she was on vacation last week and began having panic attacks mid-vacation. Pt reports ED told her she did not take enough gabapentin that she is used to which could trigger her panic attacks. Pt states she feels better but has a constant urge to swallow and feels like she will choke-although she knows she will not. Patient states she feels like she is losing her mind and just wants it to stop.  She was given Ativan in the ED which calmed her down.     Relevant past medical, surgical, family and social history reviewed and updated as indicated. Interim medical history since our last visit reviewed. Allergies and medications reviewed and updated.  Review of Systems  Psychiatric/Behavioral:  Positive for sleep disturbance. The patient is nervous/anxious.    Per HPI unless specifically indicated above     Objective:    There were no vitals taken for this visit.  Wt Readings from Last 3 Encounters:  12/21/21 227 lb (103 kg)  12/20/21 225 lb (102.1 kg)  11/15/21 229 lb 3.2 oz (104 kg)    Physical Exam Vitals and nursing note reviewed.  Constitutional:      General: She is not in acute distress.    Appearance: Normal appearance. She is obese. She is not ill-appearing, toxic-appearing or diaphoretic.  HENT:     Head: Normocephalic.     Right Ear: External ear normal.     Left Ear: External ear normal.     Nose: Nose normal.     Mouth/Throat:     Mouth: Mucous membranes are moist.     Pharynx: Oropharynx is clear.  Eyes:     General:        Right eye: No discharge.        Left eye: No discharge.     Extraocular Movements: Extraocular movements intact.     Conjunctiva/sclera: Conjunctivae normal.     Pupils: Pupils are equal, round,  and reactive to light.  Cardiovascular:     Rate and Rhythm: Normal rate and regular rhythm.     Heart sounds: No murmur heard. Pulmonary:     Effort: Pulmonary effort is normal. No respiratory distress.     Breath sounds: Normal breath sounds. No wheezing or rales.  Musculoskeletal:     Cervical back: Normal range of motion and neck supple.  Skin:    General: Skin is warm and dry.     Capillary Refill: Capillary refill takes less than 2 seconds.  Neurological:     General: No focal deficit present.     Mental Status: She is alert and oriented to person, place, and time. Mental status is at baseline.  Psychiatric:        Mood and Affect: Mood normal.        Behavior: Behavior normal.        Thought Content: Thought content normal.        Judgment: Judgment normal.    Results for orders placed or performed in visit on 10/27/21  HM PAP SMEAR  Result Value Ref Range   HM Pap smear See results scanned into chart   Results Console HPV  Result Value Ref Range   CHL  HPV Negative       Assessment & Plan:   Problem List Items Addressed This Visit   None    Follow up plan: No follow-ups on file.

## 2021-12-23 ENCOUNTER — Encounter: Payer: Self-pay | Admitting: Nurse Practitioner

## 2021-12-23 ENCOUNTER — Ambulatory Visit (INDEPENDENT_AMBULATORY_CARE_PROVIDER_SITE_OTHER): Payer: BC Managed Care – PPO | Admitting: Nurse Practitioner

## 2021-12-23 DIAGNOSIS — F419 Anxiety disorder, unspecified: Secondary | ICD-10-CM

## 2021-12-23 NOTE — Assessment & Plan Note (Signed)
Improved from last visit.  Only has had to use 2 Ativan since our visit.  Feels better knowing she has something to lean on.  A lot of the anxiety was from the stress of the trip.  Follow up in 1 month for reevaluation.

## 2022-01-21 ENCOUNTER — Encounter: Payer: Self-pay | Admitting: Nurse Practitioner

## 2022-01-21 ENCOUNTER — Ambulatory Visit (INDEPENDENT_AMBULATORY_CARE_PROVIDER_SITE_OTHER): Payer: BC Managed Care – PPO | Admitting: Nurse Practitioner

## 2022-01-21 VITALS — BP 131/82 | HR 73 | Temp 98.8°F | Wt 228.6 lb

## 2022-01-21 DIAGNOSIS — F419 Anxiety disorder, unspecified: Secondary | ICD-10-CM

## 2022-01-21 NOTE — Assessment & Plan Note (Signed)
Resolved from previous visit.  Symptoms likely related to the stress of traveling as well as not having the Gabapentin.  Patient had no concerns at this visit. Has not been using the Gabapentin.  Follow up if symptoms worsen or fail to improve.

## 2022-01-21 NOTE — Patient Instructions (Signed)
Please call to schedule your mammogram and/or bone density: ?[facility]  ?Address: [address], [place], [place] [address] ?Phone: [phone]  ?

## 2022-01-21 NOTE — Progress Notes (Signed)
BP 131/82   Pulse 73   Temp 98.8 F (37.1 C) (Oral)   Wt 228 lb 9.6 oz (103.7 kg)   SpO2 96%   BMI 35.80 kg/m    Subjective:    Patient ID: Darlene [name], female    DOB: [birth year], 45 y.o.   MRN: [id number]  HPI: Darlene [name] is a 47 y.o. female  Chief Complaint  Patient presents with   Depression   Anxiety    PANIC ATTACK Pt states she is better than she was.  She is no longer have the panicky symptoms.  She hasn't taken any of the ativan since our last visit. She is sleeping much better since she has been on back on the gabapentin.  Denies concerns at visit today.   Relevant past medical, surgical, family and social history reviewed and updated as indicated. Interim medical history since our last visit reviewed. Allergies and medications reviewed and updated.  Review of Systems  Psychiatric/Behavioral:  Negative for sleep disturbance. The patient is nervous/anxious.     Per HPI unless specifically indicated above     Objective:    BP 131/82   Pulse 73   Temp 98.8 F (37.1 C) (Oral)   Wt 228 lb 9.6 oz (103.7 kg)   SpO2 96%   BMI 35.80 kg/m   Wt Readings from Last 3 Encounters:  01/21/22 228 lb 9.6 oz (103.7 kg)  12/23/21 223 lb 12.8 oz (101.5 kg)  12/21/21 227 lb (103 kg)    Physical Exam Vitals and nursing note reviewed.  Constitutional:      General: She is not in acute distress.    Appearance: Normal appearance. She is obese. She is not ill-appearing, toxic-appearing or diaphoretic.  HENT:     Head: Normocephalic.     Right Ear: External ear normal.     Left Ear: External ear normal.     Nose: Nose normal.     Mouth/Throat:     Mouth: Mucous membranes are moist.     Pharynx: Oropharynx is clear.  Eyes:     General:        Right eye: No discharge.        Left eye: No discharge.     Extraocular Movements: Extraocular movements intact.     Conjunctiva/sclera: Conjunctivae normal.     Pupils: Pupils are equal, round, and reactive to  light.  Cardiovascular:     Rate and Rhythm: Normal rate and regular rhythm.     Heart sounds: No murmur heard. Pulmonary:     Effort: Pulmonary effort is normal. No respiratory distress.     Breath sounds: Normal breath sounds. No wheezing or rales.  Musculoskeletal:     Cervical back: Normal range of motion and neck supple.  Skin:    General: Skin is warm and dry.     Capillary Refill: Capillary refill takes less than 2 seconds.  Neurological:     General: No focal deficit present.     Mental Status: She is alert and oriented to person, place, and time. Mental status is at baseline.  Psychiatric:        Mood and Affect: Mood normal.        Behavior: Behavior normal.        Thought Content: Thought content normal.        Judgment: Judgment normal.     Results for orders placed or performed in visit on 10/27/21  HM PAP SMEAR  Result Value Ref Range  HM Pap smear See results scanned into chart   Results Console HPV  Result Value Ref Range   CHL HPV Negative       Assessment & Plan:   Problem List Items Addressed This Visit       Other   Anxiety - Primary    Resolved from previous visit.  Symptoms likely related to the stress of traveling as well as not having the Gabapentin.  Patient had no concerns at this visit. Has not been using the Gabapentin.  Follow up if symptoms worsen or fail to improve.        Follow up plan: Return if symptoms worsen or fail to improve.

## 2022-02-16 ENCOUNTER — Other Ambulatory Visit: Payer: Self-pay | Admitting: Nurse Practitioner

## 2022-02-16 DIAGNOSIS — R921 Mammographic calcification found on diagnostic imaging of breast: Secondary | ICD-10-CM

## 2022-02-17 ENCOUNTER — Other Ambulatory Visit: Payer: Self-pay | Admitting: Sports Medicine

## 2022-02-17 DIAGNOSIS — M778 Other enthesopathies, not elsewhere classified: Secondary | ICD-10-CM

## 2022-02-17 DIAGNOSIS — M7541 Impingement syndrome of right shoulder: Secondary | ICD-10-CM

## 2022-02-17 DIAGNOSIS — M67911 Unspecified disorder of synovium and tendon, right shoulder: Secondary | ICD-10-CM

## 2022-02-17 DIAGNOSIS — G8929 Other chronic pain: Secondary | ICD-10-CM

## 2022-02-17 DIAGNOSIS — M19011 Primary osteoarthritis, right shoulder: Secondary | ICD-10-CM

## 2022-03-04 ENCOUNTER — Ambulatory Visit
Admission: RE | Admit: 2022-03-04 | Discharge: 2022-03-04 | Disposition: A | Discharge status: Home / Self Care | Payer: BC Managed Care – PPO | Source: Ambulatory Visit | Attending: Sports Medicine | Admitting: Sports Medicine

## 2022-03-04 DIAGNOSIS — M19011 Primary osteoarthritis, right shoulder: Secondary | ICD-10-CM

## 2022-03-04 DIAGNOSIS — M778 Other enthesopathies, not elsewhere classified: Secondary | ICD-10-CM | POA: Diagnosis present

## 2022-03-04 DIAGNOSIS — M25511 Pain in right shoulder: Secondary | ICD-10-CM | POA: Diagnosis present

## 2022-03-04 DIAGNOSIS — G8929 Other chronic pain: Secondary | ICD-10-CM | POA: Diagnosis present

## 2022-03-04 DIAGNOSIS — M7541 Impingement syndrome of right shoulder: Secondary | ICD-10-CM | POA: Diagnosis present

## 2022-03-04 DIAGNOSIS — M67911 Unspecified disorder of synovium and tendon, right shoulder: Secondary | ICD-10-CM

## 2022-03-18 ENCOUNTER — Ambulatory Visit
Admission: RE | Admit: 2022-03-18 | Discharge: 2022-03-18 | Disposition: A | Discharge status: Home / Self Care | Payer: BC Managed Care – PPO | Source: Ambulatory Visit | Attending: Nurse Practitioner | Admitting: Nurse Practitioner

## 2022-03-18 DIAGNOSIS — R921 Mammographic calcification found on diagnostic imaging of breast: Secondary | ICD-10-CM | POA: Diagnosis present

## 2022-03-21 NOTE — Progress Notes (Signed)
Please let patient know her mammogram was benign.  She does have some calcifications in her left breast.  No concerns at this time.  Repeat Mammogram in 1 year.

## 2022-05-02 ENCOUNTER — Ambulatory Visit: Payer: BC Managed Care – PPO | Admitting: Nurse Practitioner

## 2022-05-03 ENCOUNTER — Other Ambulatory Visit: Payer: Self-pay | Admitting: Nurse Practitioner

## 2022-05-04 NOTE — Telephone Encounter (Signed)
Requested medication (s) are due for refill today: yes  Requested medication (s) are on the active medication list: yes    Last refill: 10/27/21  #40 0 refills  Future visit scheduled yes 05/19/22  Notes to clinic:Not delegated, please review. Thank you.  Requested Prescriptions  Pending Prescriptions Disp Refills   traMADol (ULTRAM) 50 MG tablet [Pharmacy Med Name: TRAMADOL HCL 50 MG TABLET] 40 tablet     Sig: TAKE 2 TABLETS BY MOUTH EVERY 6 HOURS AS NEEDED.     Not Delegated - Analgesics:  Opioid Agonists Failed - 05/03/2022  4:54 PM      Failed - This refill cannot be delegated      Failed - Urine Drug Screen completed in last 360 days      Failed - Valid encounter within last 3 months    Recent Outpatient Visits           3 months ago [place], NP   4 months ago [place], Karen, NP   4 months ago [place], Karen, NP   5 months ago Primary hypertension   [facility] Jon Billings, NP   6 months ago Annual physical exam   [facility] Jon Billings, NP       Future Appointments             In 2 weeks Jon Billings, NP [facility] [place]

## 2022-05-18 DIAGNOSIS — E785 Hyperlipidemia, unspecified: Secondary | ICD-10-CM | POA: Insufficient documentation

## 2022-05-18 NOTE — Progress Notes (Deleted)
   There were no vitals taken for this visit.   Subjective:    Patient ID: Darlene [name], female    DOB: [birth year], 46 y.o.   MRN: [id number]  HPI: Darlene [name] is a 46 y.o. female  No chief complaint on file.  HYPERTENSION / HYPERLIPIDEMIA Satisfied with current treatment? {Blank single:19197::"yes","no"} Duration of hypertension: {Blank single:19197::"chronic","months","years"} BP monitoring frequency: {Blank single:19197::"not checking","rarely","daily","weekly","monthly","a few times a day","a few times a week","a few times a month"} BP range:  BP medication side effects: {Blank single:19197::"yes","no"} Past BP meds: {Blank ZHGDJMEQ:68341::"DQQI","WLNLGXQJJH","ERDEYCXKGY/JEHUDJSHFW","YOVZCHYI","FOYDXAJOIN","OMVEHMCNOB/SJGG","EZMOQHUTML (bystolic)","carvedilol","chlorthalidone","clonidine","diltiazem","exforge HCT","HCTZ","irbesartan (avapro)","labetalol","lisinopril","lisinopril-HCTZ","losartan (cozaar)","methyldopa","nifedipine","olmesartan (benicar)","olmesartan-HCTZ","quinapril","ramipril","spironalactone","tekturna","valsartan","valsartan-HCTZ","verapamil"} Duration of hyperlipidemia: {Blank single:19197::"chronic","months","years"} Cholesterol medication side effects: {Blank single:19197::"yes","no"} Cholesterol supplements: {Blank multiple:19196::"none","fish oil","niacin","red yeast rice"} Past cholesterol medications: {Blank multiple:19196::"none","atorvastain (lipitor)","lovastatin (mevacor)","pravastatin (pravachol)","rosuvastatin (crestor)","simvastatin (zocor)","vytorin","fenofibrate (tricor)","gemfibrozil","ezetimide (zetia)","niaspan","lovaza"} Medication compliance: {Blank single:19197::"excellent compliance","good compliance","fair compliance","poor compliance"} Aspirin: {Blank single:19197::"yes","no"} Recent stressors: {Blank single:19197::"yes","no"} Recurrent headaches: {Blank single:19197::"yes","no"} Visual changes: {Blank  single:19197::"yes","no"} Palpitations: {Blank single:19197::"yes","no"} Dyspnea: {Blank single:19197::"yes","no"} Chest pain: {Blank single:19197::"yes","no"} Lower extremity edema: {Blank single:19197::"yes","no"} Dizzy/lightheaded: {Blank single:19197::"yes","no"}  Relevant past medical, surgical, family and social history reviewed and updated as indicated. Interim medical history since our last visit reviewed. Allergies and medications reviewed and updated.  Review of Systems  Per HPI unless specifically indicated above     Objective:    There were no vitals taken for this visit.  Wt Readings from Last 3 Encounters:  01/21/22 228 lb 9.6 oz (103.7 kg)  12/23/21 223 lb 12.8 oz (101.5 kg)  12/21/21 227 lb (103 kg)    Physical Exam  Results for orders placed or performed in visit on 10/27/21  HM PAP SMEAR  Result Value Ref Range   HM Pap smear See results scanned into chart   Results Console HPV  Result Value Ref Range   CHL HPV Negative       Assessment & Plan:   Problem List Items Addressed This Visit       Cardiovascular and Mediastinum   Primary hypertension - Primary     Other   Anxiety   Hyperlipidemia     Follow up plan: No follow-ups on file.

## 2022-05-19 ENCOUNTER — Ambulatory Visit: Payer: BC Managed Care – PPO | Admitting: Nurse Practitioner

## 2022-05-19 DIAGNOSIS — I10 Essential (primary) hypertension: Secondary | ICD-10-CM

## 2022-05-19 DIAGNOSIS — E785 Hyperlipidemia, unspecified: Secondary | ICD-10-CM

## 2022-05-19 DIAGNOSIS — F419 Anxiety disorder, unspecified: Secondary | ICD-10-CM

## 2022-05-30 ENCOUNTER — Other Ambulatory Visit: Payer: Self-pay | Admitting: Nurse Practitioner

## 2022-05-31 MED ORDER — GABAPENTIN 300 MG PO CAPS: ORAL_CAPSULE | ORAL | 1 refills | Status: DC

## 2022-05-31 NOTE — Telephone Encounter (Signed)
Requested medication (s) are due for refill today: yes  Requested medication (s) are on the active medication list: yes  Last refill:  11/15/21 #90 1 RF  Future visit scheduled: yes  Notes to clinic:  overdue lab work   Requested Prescriptions  Pending Prescriptions Disp Refills   valsartan (DIOVAN) 40 MG tablet [Pharmacy Med Name: VALSARTAN 40 MG TABLET] 90 tablet     Sig: TAKE 1 TABLET BY MOUTH EVERY DAY     Cardiovascular:  Angiotensin Receptor Blockers Failed - 05/30/2022 12:51 PM      Failed - Cr in normal range and within 180 days    Creatinine  Date Value Ref Range Status  04/28/2020 60.2 20.0 - 300.0 mg/dL Final   Creatinine, Ser  Date Value Ref Range Status  10/27/2021 0.83 0.57 - 1.00 mg/dL Final         Failed - K in normal range and within 180 days    Potassium  Date Value Ref Range Status  10/27/2021 4.0 3.5 - 5.2 mmol/L Final         Passed - Patient is not pregnant      Passed - Last BP in normal range    BP Readings from Last 1 Encounters:  01/21/22 131/82         Passed - Valid encounter within last 6 months    Recent Outpatient Visits           4 months ago Anxiety   [facility] Larae Grooms, NP   5 months ago Anxiety   [facility] Larae Grooms, NP   5 months ago Anxiety   [facility] Larae Grooms, NP   6 months ago Primary hypertension   [facility] Larae Grooms, NP   7 months ago Annual physical exam   [facility] Larae Grooms, NP       Future Appointments             In 1 week Larae Grooms, NP [facility]            Refused Prescriptions Disp Refills   gabapentin (NEURONTIN) 300 MG capsule [Pharmacy Med Name: GABAPENTIN 300 MG CAPSULE] 270 capsule     Sig: TAKE 1 TAB 3X DAILY     Neurology: Anticonvulsants - gabapentin Passed - 05/30/2022 12:51 PM      Passed - Cr in normal range and within 360 days    Creatinine   Date Value Ref Range Status  04/28/2020 60.2 20.0 - 300.0 mg/dL Final   Creatinine, Ser  Date Value Ref Range Status  10/27/2021 0.83 0.57 - 1.00 mg/dL Final         Passed - Completed PHQ-2 or PHQ-9 in the last 360 days      Passed - Valid encounter within last 12 months    Recent Outpatient Visits           4 months ago Anxiety   [facility] Larae Grooms, NP   5 months ago Anxiety   [facility] Larae Grooms, NP   5 months ago Anxiety   [facility] Larae Grooms, NP   6 months ago Primary hypertension   [facility] Larae Grooms, NP   7 months ago Annual physical exam   [facility] Larae Grooms, NP       Future Appointments             In 1 week Larae Grooms,  NP [facility], [facility]

## 2022-05-31 NOTE — Telephone Encounter (Signed)
Requested Prescriptions  Pending Prescriptions Disp Refills   gabapentin (NEURONTIN) 300 MG capsule [Pharmacy Med Name: GABAPENTIN 300 MG CAPSULE] 270 capsule     Sig: TAKE 1 TAB 3X DAILY     Neurology: Anticonvulsants - gabapentin Passed - 05/30/2022 12:51 PM      Passed - Cr in normal range and within 360 days    Creatinine  Date Value Ref Range Status  04/28/2020 60.2 20.0 - 300.0 mg/dL Final   Creatinine, Ser  Date Value Ref Range Status  10/27/2021 0.83 0.57 - 1.00 mg/dL Final         Passed - Completed PHQ-2 or PHQ-9 in the last 360 days      Passed - Valid encounter within last 12 months    Recent Outpatient Visits           4 months ago [place], Karen, NP   5 months ago [place], Karen, NP   5 months ago [place], Karen, NP   6 months ago Primary hypertension   [facility] Jon Billings, NP   7 months ago Annual physical exam   [facility] Jon Billings, NP       Future Appointments             In 1 week Jon Billings, NP [place]             valsartan (DIOVAN) 40 MG tablet [Pharmacy Med Name: VALSARTAN 40 MG TABLET] 90 tablet     Sig: TAKE 1 TABLET BY MOUTH EVERY DAY     Cardiovascular:  Angiotensin Receptor Blockers Failed - 05/30/2022 12:51 PM      Failed - Cr in normal range and within 180 days    Creatinine  Date Value Ref Range Status  04/28/2020 60.2 20.0 - 300.0 mg/dL Final   Creatinine, Ser  Date Value Ref Range Status  10/27/2021 0.83 0.57 - 1.00 mg/dL Final         Failed - K in normal range and within 180 days    Potassium  Date Value Ref Range Status  10/27/2021 4.0 3.5 - 5.2 mmol/L Final         Passed - Patient is not pregnant      Passed - Last BP in normal range    BP Readings from Last 1 Encounters:  01/21/22 131/82         Passed - Valid encounter  within last 6 months    Recent Outpatient Visits           4 months ago [place], NP   5 months ago [place], Karen, NP   5 months ago [place], Karen, NP   6 months ago Primary hypertension   [facility] Jon Billings, NP   7 months ago Annual physical exam   [facility] Jon Billings, NP       Future Appointments             In 1 week Jon Billings, NP [facility], [facility]            '

## 2022-06-06 NOTE — Progress Notes (Unsigned)
   There were no vitals taken for this visit.   Subjective:    Patient ID: Darlene [name], female    DOB: 08-02-1975, 46 y.o.   MRN: [id number]  HPI: Darlene [name] is a 46 y.o. female  No chief complaint on file.  HYPERTENSION / HYPERLIPIDEMIA Satisfied with current treatment? {Blank single:19197::"yes","no"} Duration of hypertension: {Blank single:19197::"chronic","months","years"} BP monitoring frequency: {Blank single:19197::"not checking","rarely","daily","weekly","monthly","a few times a day","a few times a week","a few times a month"} BP range:  BP medication side effects: {Blank single:19197::"yes","no"} Past BP meds: {Blank multiple:19196::"none","amlodipine","amlodipine/benazepril","atenolol","benazepril","benazepril/HCTZ","bisoprolol (bystolic)","carvedilol","chlorthalidone","clonidine","diltiazem","exforge HCT","HCTZ","irbesartan (avapro)","labetalol","lisinopril","lisinopril-HCTZ","losartan (cozaar)","methyldopa","nifedipine","olmesartan (benicar)","olmesartan-HCTZ","quinapril","ramipril","spironalactone","tekturna","valsartan","valsartan-HCTZ","verapamil"} Duration of hyperlipidemia: {Blank single:19197::"chronic","months","years"} Cholesterol medication side effects: {Blank single:19197::"yes","no"} Cholesterol supplements: {Blank multiple:19196::"none","fish oil","niacin","red yeast rice"} Past cholesterol medications: {Blank multiple:19196::"none","atorvastain (lipitor)","lovastatin (mevacor)","pravastatin (pravachol)","rosuvastatin (crestor)","simvastatin (zocor)","vytorin","fenofibrate (tricor)","gemfibrozil","ezetimide (zetia)","niaspan","lovaza"} Medication compliance: {Blank single:19197::"excellent compliance","good compliance","fair compliance","poor compliance"} Aspirin: {Blank single:19197::"yes","no"} Recent stressors: {Blank single:19197::"yes","no"} Recurrent headaches: {Blank single:19197::"yes","no"} Visual changes: {Blank  single:19197::"yes","no"} Palpitations: {Blank single:19197::"yes","no"} Dyspnea: {Blank single:19197::"yes","no"} Chest pain: {Blank single:19197::"yes","no"} Lower extremity edema: {Blank single:19197::"yes","no"} Dizzy/lightheaded: {Blank single:19197::"yes","no"}  Relevant past medical, surgical, family and social history reviewed and updated as indicated. Interim medical history since our last visit reviewed. Allergies and medications reviewed and updated.  Review of Systems  Per HPI unless specifically indicated above     Objective:    There were no vitals taken for this visit.  Wt Readings from Last 3 Encounters:  01/21/22 228 lb 9.6 oz (103.7 kg)  12/23/21 223 lb 12.8 oz (101.5 kg)  12/21/21 227 lb (103 kg)    Physical Exam  Results for orders placed or performed in visit on 10/27/21  HM PAP SMEAR  Result Value Ref Range   HM Pap smear See results scanned into chart   Results Console HPV  Result Value Ref Range   CHL HPV Negative       Assessment & Plan:   Problem List Items Addressed This Visit       Cardiovascular and Mediastinum   Primary hypertension - Primary     Other   Obesity (BMI 35.0-39.9 without comorbidity)   Hyperlipidemia     Follow up plan: No follow-ups on file.

## 2022-06-07 ENCOUNTER — Encounter: Payer: Self-pay | Admitting: Nurse Practitioner

## 2022-06-07 ENCOUNTER — Ambulatory Visit (INDEPENDENT_AMBULATORY_CARE_PROVIDER_SITE_OTHER): Payer: BC Managed Care – PPO | Admitting: Nurse Practitioner

## 2022-06-07 VITALS — BP 130/80 | HR 80 | Temp 98.6°F | Wt 231.0 lb

## 2022-06-07 DIAGNOSIS — E669 Obesity, unspecified: Secondary | ICD-10-CM | POA: Diagnosis not present

## 2022-06-07 DIAGNOSIS — I10 Essential (primary) hypertension: Secondary | ICD-10-CM

## 2022-06-07 DIAGNOSIS — E785 Hyperlipidemia, unspecified: Secondary | ICD-10-CM

## 2022-06-07 DIAGNOSIS — Z23 Encounter for immunization: Secondary | ICD-10-CM

## 2022-06-07 MED ORDER — VALSARTAN 40 MG PO TABS: 40.0000 mg | ORAL_TABLET | Freq: Every day | ORAL | 1 refills | Status: DC

## 2022-06-07 NOTE — Assessment & Plan Note (Signed)
Chronic.  Controlled without medication..  Labs ordered today.  Return to clinic in 6 months for reevaluation.  Call sooner if concerns arise.  ° °

## 2022-06-07 NOTE — Assessment & Plan Note (Addendum)
BMI 36. Recommended eating smaller high protein, low fat meals more frequently and exercising 30 mins a day 5 times a week with a goal of 10-15lb weight loss in the next 3 months. Patient voiced their understanding and motivation to adhere to these recommendations.  

## 2022-06-07 NOTE — Assessment & Plan Note (Signed)
Chronic.  Controlled.  Continue with current medication regimen of Valsartan 40mg .  Labs ordered today.  Return to clinic in 6 months for reevaluation.  Call sooner if concerns arise.

## 2022-06-08 LAB — COMPREHENSIVE METABOLIC PANEL
ALT: 15 IU/L (ref 0–32)
AST: 16 IU/L (ref 0–40)
Albumin/Globulin Ratio: 1.5 (ref 1.2–2.2)
Albumin: 4.2 g/dL (ref 3.9–4.9)
Alkaline Phosphatase: 22 IU/L — ABNORMAL LOW (ref 44–121)
BUN/Creatinine Ratio: 11 (ref 9–23)
BUN: 12 mg/dL (ref 6–24)
Bilirubin Total: 0.2 mg/dL (ref 0.0–1.2)
CO2: 23 mmol/L (ref 20–29)
Calcium: 10 mg/dL (ref 8.7–10.2)
Chloride: 101 mmol/L (ref 96–106)
Creatinine, Ser: 1.12 mg/dL — ABNORMAL HIGH (ref 0.57–1.00)
Globulin, Total: 2.8 g/dL (ref 1.5–4.5)
Glucose: 112 mg/dL — ABNORMAL HIGH (ref 70–99)
Potassium: 3.9 mmol/L (ref 3.5–5.2)
Sodium: 139 mmol/L (ref 134–144)
Total Protein: 7 g/dL (ref 6.0–8.5)
eGFR: 61 mL/min/{1.73_m2} (ref >59)

## 2022-06-08 LAB — LIPID PANEL
Chol/HDL Ratio: 3.6 ratio (ref 0.0–4.4)
Cholesterol, Total: 198 mg/dL (ref 100–199)
HDL: 55 mg/dL (ref >39)
LDL Chol Calc (NIH): 125 mg/dL — ABNORMAL HIGH (ref 0–99)
Triglycerides: 98 mg/dL (ref 0–149)
VLDL Cholesterol Cal: 18 mg/dL (ref 5–40)

## 2022-06-08 NOTE — Progress Notes (Signed)
Good Morning. It was nice to see you yesterday.  Your lab work looks good.  Your kidneys looks a little dehydrated.  Make sure you are drinking plenty of water.  Your glucose was elevated but likely because you were fasting.  Your LDL or bad cholesterol is slightly elevated.  Continue with a low fat diet and exerciser.  No other concerns at this time. Continue with your current medication regimen.  Follow up as discussed.  Please let me know if you have any questions.

## 2022-10-31 ENCOUNTER — Other Ambulatory Visit: Payer: Self-pay | Admitting: Nurse Practitioner

## 2022-11-01 NOTE — Telephone Encounter (Signed)
Requested medication (s) are due for refill today - yes  Requested medication (s) are on the active medication list -yes  Future visit scheduled -yes  Last refill: 05/04/22 #40  Notes to clinic: non delegated Rx  Requested Prescriptions  Pending Prescriptions Disp Refills   traMADol (ULTRAM) 50 MG tablet [Pharmacy Med Name: TRAMADOL HCL 50 MG TABLET] 40 tablet     Sig: TAKE 2 TABLETS BY MOUTH EVERY 6 HOURS AS NEEDED.     Not Delegated - Analgesics:  Opioid Agonists Failed - 10/31/2022  1:26 PM      Failed - This refill cannot be delegated      Failed - Urine Drug Screen completed in last 360 days      Failed - Valid encounter within last 3 months    Recent Outpatient Visits           4 months ago Primary hypertension   [place] [facility] Larae Grooms, NP   9 months ago Anxiety   [facility] Larae Grooms, NP   10 months ago Anxiety   [place] [facility] Larae Grooms, NP   10 months ago Anxiety   [place] [facility] Larae Grooms, NP   11 months ago Primary hypertension   [place] [facility] Larae Grooms, NP       Future Appointments             In 1 month Larae Grooms, NP [facility]               Requested Prescriptions  Pending Prescriptions Disp Refills   traMADol (ULTRAM) 50 MG tablet [Pharmacy Med Name: TRAMADOL HCL 50 MG TABLET] 40 tablet     Sig: TAKE 2 TABLETS BY MOUTH EVERY 6 HOURS AS NEEDED.     Not Delegated - Analgesics:  Opioid Agonists Failed - 10/31/2022  1:26 PM      Failed - This refill cannot be delegated      Failed - Urine Drug Screen completed in last 360 days      Failed - Valid encounter within last 3 months    Recent Outpatient Visits           4 months ago Primary hypertension   [place] [facility] Larae Grooms, NP   9 months ago Anxiety   [place]  [facility] Larae Grooms, NP   10 months ago Anxiety   [place] [facility] Larae Grooms, NP   10 months ago Anxiety   [place] [facility] Larae Grooms, NP   11 months ago Primary hypertension    [facility] Larae Grooms, NP       Future Appointments             In 1 month Larae Grooms, NP  [facility]

## 2022-12-01 ENCOUNTER — Other Ambulatory Visit: Payer: Self-pay | Admitting: Nurse Practitioner

## 2022-12-01 NOTE — Telephone Encounter (Signed)
Requested Prescriptions  Pending Prescriptions Disp Refills   gabapentin (NEURONTIN) 300 MG capsule [Pharmacy Med Name: GABAPENTIN 300 MG CAPSULE] 270 capsule 0    Sig: TAKE 1 TAB 3X DAILY     Neurology: Anticonvulsants - gabapentin Failed - 12/01/2022  2:51 PM      Failed - Cr in normal range and within 360 days    Creatinine  Date Value Ref Range Status  04/28/2020 60.2 20.0 - 300.0 mg/dL Final   Creatinine, Ser  Date Value Ref Range Status  06/07/2022 1.12 (H) 0.57 - 1.00 mg/dL Final         Passed - Completed PHQ-2 or PHQ-9 in the last 360 days      Passed - Valid encounter within last 12 months    Recent Outpatient Visits           5 months ago Primary hypertension   [place] [facility] Larae Grooms, NP   10 months ago Anxiety   [place] [facility] Larae Grooms, NP   11 months ago Anxiety   [place] [facility] Larae Grooms, NP   11 months ago Anxiety   [facility] Larae Grooms, NP   1 year ago Primary hypertension   [place] [facility] Larae Grooms, NP       Future Appointments             In 6 days Larae Grooms, NP  [facility]

## 2022-12-07 ENCOUNTER — Ambulatory Visit (INDEPENDENT_AMBULATORY_CARE_PROVIDER_SITE_OTHER): Payer: BC Managed Care – PPO | Admitting: Nurse Practitioner

## 2022-12-07 ENCOUNTER — Encounter: Payer: Self-pay | Admitting: Nurse Practitioner

## 2022-12-07 VITALS — BP 132/85 | HR 80 | Temp 98.6°F | Ht 68.0 in | Wt 233.4 lb

## 2022-12-07 DIAGNOSIS — Z0000 Encounter for general adult medical examination without abnormal findings: Secondary | ICD-10-CM | POA: Diagnosis not present

## 2022-12-07 DIAGNOSIS — I10 Essential (primary) hypertension: Secondary | ICD-10-CM

## 2022-12-07 DIAGNOSIS — E785 Hyperlipidemia, unspecified: Secondary | ICD-10-CM

## 2022-12-07 DIAGNOSIS — E669 Obesity, unspecified: Secondary | ICD-10-CM | POA: Diagnosis not present

## 2022-12-07 DIAGNOSIS — F419 Anxiety disorder, unspecified: Secondary | ICD-10-CM

## 2022-12-07 LAB — URINALYSIS, ROUTINE W REFLEX MICROSCOPIC
Bilirubin, UA: NEGATIVE
Glucose, UA: NEGATIVE
Ketones, UA: NEGATIVE
Leukocytes,UA: NEGATIVE
Nitrite, UA: NEGATIVE
RBC, UA: NEGATIVE
Specific Gravity, UA: >1.03 — ABNORMAL HIGH (ref 1.005–1.030)
Urobilinogen, Ur: 1 mg/dL (ref 0.2–1.0)
pH, UA: 5 (ref 5.0–7.5)

## 2022-12-07 LAB — MICROSCOPIC EXAMINATION
Bacteria, UA: NONE SEEN
WBC, UA: NONE SEEN /hpf (ref 0–5)

## 2022-12-07 NOTE — Assessment & Plan Note (Signed)
Chronic.  Controlled.  Continue with current medication regimen of Valsartan 40mg .  Refills sent today.  Labs ordered today.  Return to clinic in 6 months for reevaluation.  Call sooner if concerns arise.

## 2022-12-07 NOTE — Assessment & Plan Note (Signed)
Recommended eating smaller high protein, low fat meals more frequently and exercising 30 mins a day 5 times a week with a goal of 10-15lb weight loss in the next 3 months.  

## 2022-12-07 NOTE — Assessment & Plan Note (Signed)
Chronic.  Controlled.  Continue with current medication regimen.  Labs ordered today.  Return to clinic in 6 months for reevaluation.  Call sooner if concerns arise.  ? ?

## 2022-12-07 NOTE — Assessment & Plan Note (Signed)
Labs ordered at visit today.  Will make recommendations based on lab results.   

## 2022-12-07 NOTE — Progress Notes (Signed)
BP 132/85   Pulse 80   Temp 98.6 F (37 C) (Oral)   Ht 5\' 8"  (1.727 m)   Wt 233 lb 6.4 oz (105.9 kg)   SpO2 98%   BMI 35.49 kg/m    Subjective:    Patient ID: Darlene [name], female    DOB: Nov 15, 1975, 47 y.o.   MRN: [id number]  HPI: Darlene [name] is a 47 y.o. female presenting on 12/07/2022 for comprehensive medical examination. Current medical complaints include: none  She currently lives with: Menopausal Symptoms: no  HYPERTENSION Hypertension status: controlled Satisfied with current treatment? no Duration of hypertension: years BP monitoring frequency:  not checking BP range:  BP medication side effects:  no Medication compliance: poor compliance Previous BP meds: Valsartan  Aspirin: no Recurrent headaches: no Visual changes: no Palpitations: no Dyspnea: no Chest pain: no Lower extremity edema: no Dizzy/lightheaded: no  Depression Screen done today and results listed below:     12/07/2022    9:08 AM 06/07/2022    3:50 PM 01/21/2022    2:39 PM 12/23/2021   10:15 AM 12/21/2021   11:19 AM  Depression screen PHQ 2/9  Decreased Interest 0 0 0 0 0  Down, Depressed, Hopeless 0 0 0 0 0  PHQ - 2 Score 0 0 0 0 0  Altered sleeping 1 0 0 2 3  Tired, decreased energy 0 0 0 1 2  Change in appetite 0 0 0 0 0  Feeling bad or failure about yourself  0 0 0 0 0  Trouble concentrating 0 0 0 0 0  Moving slowly or fidgety/restless 0 0 0 0 0  Suicidal thoughts 0 0 0 0 0  PHQ-9 Score 1 0 0 3 5  Difficult doing work/chores  Not difficult at all Not difficult at all Somewhat difficult Somewhat difficult    The patient does not have a history of falls. I did complete a risk assessment for falls. A plan of care for falls was documented.   Past Medical History:  Past Medical History:  Diagnosis Date   Ankle pain    Hypertension     Surgical History:  Past Surgical History:  Procedure Laterality Date   ABLATION     uterus   ACHILLES TENDON REPAIR Right 2016    ACHILLES TENDON REPAIR Right 2017   COLONOSCOPY WITH PROPOFOL N/A 06/11/2021   Procedure: COLONOSCOPY WITH PROPOFOL;  Surgeon: Pasty Spillers, MD;  Location: [facility] ENDOSCOPY;  Service: Endoscopy;  Laterality: N/A;    Medications:  Current Outpatient Medications on File Prior to Visit  Medication Sig   etonogestrel (NEXPLANON) 68 MG IMPL implant 1 each by Subdermal route once.   gabapentin (NEURONTIN) 300 MG capsule TAKE 1 TAB 3X DAILY   traMADol (ULTRAM) 50 MG tablet TAKE 2 TABLETS BY MOUTH EVERY 6 HOURS AS NEEDED   valsartan (DIOVAN) 40 MG tablet Take 1 tablet (40 mg total) by mouth daily.   No current facility-administered medications on file prior to visit.    Allergies:  No Known Allergies  Social History:  Social History   Socioeconomic History   Marital status: Married    Spouse name: Not on file   Number of children: Not on file   Years of education: Not on file   Highest education level: Not on file  Occupational History   Not on file  Tobacco Use   Smoking status: Never   Smokeless tobacco: Never  Vaping Use   Vaping Use: Never used  Substance and Sexual Activity   Alcohol use: Yes    Comment: occ   Drug use: Never   Sexual activity: Yes    Partners: Male    Birth control/protection: Implant  Other Topics Concern   Not on file  Social History Narrative   Patient lives with husband. Recently relocated to [facility] from [place], and prior to that lived in [place]. Patient is from [place] and her family continues to reside there.    Social Determinants of Health   Financial Resource Strain: Not on file  Food Insecurity: Not on file  Transportation Needs: Not on file  Physical Activity: Not on file  Stress: Not on file  Social Connections: Not on file  Intimate Partner Violence: Not on file   Social History   Tobacco Use  Smoking Status Never  Smokeless Tobacco Never   Social History   Substance and Sexual Activity  Alcohol Use Yes   Comment: occ     Family History:  Family History  Problem Relation Age of Onset   Diabetes Mother    Thyroid disease Mother    Colon cancer Father    Diabetes Father    Heart disease Father    ADD / ADHD Son    Diabetes Maternal Grandmother    Breast cancer Neg Hx     Past medical history, surgical history, medications, allergies, family history and social history reviewed with patient today and changes made to appropriate areas of the chart.   Review of Systems  Eyes:  Negative for blurred vision and double vision.  Respiratory:  Negative for shortness of breath.   Cardiovascular:  Negative for chest pain, palpitations and leg swelling.  Neurological:  Negative for dizziness and headaches.   All other ROS negative except what is listed above and in the HPI.      Objective:    BP 132/85   Pulse 80   Temp 98.6 F (37 C) (Oral)   Ht 5\' 8"  (1.727 m)   Wt 233 lb 6.4 oz (105.9 kg)   SpO2 98%   BMI 35.49 kg/m   Wt Readings from Last 3 Encounters:  12/07/22 233 lb 6.4 oz (105.9 kg)  06/07/22 231 lb (104.8 kg)  01/21/22 228 lb 9.6 oz (103.7 kg)    Physical Exam Vitals and nursing note reviewed.  Constitutional:      General: She is awake. She is not in acute distress.    Appearance: Normal appearance. She is well-developed. She is obese. She is not ill-appearing.  HENT:     Head: Normocephalic and atraumatic.     Right Ear: Hearing, tympanic membrane, ear canal and external ear normal. No drainage.     Left Ear: Hearing, tympanic membrane, ear canal and external ear normal. No drainage.     Nose: Nose normal.     Right Sinus: No maxillary sinus tenderness or frontal sinus tenderness.     Left Sinus: No maxillary sinus tenderness or frontal sinus tenderness.     Mouth/Throat:     Mouth: Mucous membranes are moist.     Pharynx: Oropharynx is clear. Uvula midline. No pharyngeal swelling, oropharyngeal exudate or posterior oropharyngeal erythema.  Eyes:     General: Lids are normal.         Right eye: No discharge.        Left eye: No discharge.     Extraocular Movements: Extraocular movements intact.     Conjunctiva/sclera: Conjunctivae normal.     Pupils: Pupils  are equal, round, and reactive to light.     Visual Fields: Right eye visual fields normal and left eye visual fields normal.  Neck:     Thyroid: No thyromegaly.     Vascular: No carotid bruit.     Trachea: Trachea normal.  Cardiovascular:     Rate and Rhythm: Normal rate and regular rhythm.     Heart sounds: Normal heart sounds. No murmur heard.    No gallop.  Pulmonary:     Effort: Pulmonary effort is normal. No accessory muscle usage or respiratory distress.     Breath sounds: Normal breath sounds.  Chest:  Breasts:    Right: Normal.     Left: Normal.  Abdominal:     General: Bowel sounds are normal.     Palpations: Abdomen is soft. There is no hepatomegaly or splenomegaly.     Tenderness: There is no abdominal tenderness.  Musculoskeletal:        General: Normal range of motion.     Cervical back: Normal range of motion and neck supple.     Right lower leg: No edema.     Left lower leg: No edema.  Lymphadenopathy:     Head:     Right side of head: No submental, submandibular, tonsillar, preauricular or posterior auricular adenopathy.     Left side of head: No submental, submandibular, tonsillar, preauricular or posterior auricular adenopathy.     Cervical: No cervical adenopathy.     Upper Body:     Right upper body: No supraclavicular, axillary or pectoral adenopathy.     Left upper body: No supraclavicular, axillary or pectoral adenopathy.  Skin:    General: Skin is warm and dry.     Capillary Refill: Capillary refill takes less than 2 seconds.     Findings: No rash.  Neurological:     Mental Status: She is alert and oriented to person, place, and time.     Gait: Gait is intact.     Deep Tendon Reflexes: Reflexes are normal and symmetric.     Reflex Scores:      Brachioradialis  reflexes are 2+ on the right side and 2+ on the left side.      Patellar reflexes are 2+ on the right side and 2+ on the left side. Psychiatric:        Attention and Perception: Attention normal.        Mood and Affect: Mood normal.        Speech: Speech normal.        Behavior: Behavior normal. Behavior is cooperative.        Thought Content: Thought content normal.        Judgment: Judgment normal.     Results for orders placed or performed in visit on 06/07/22  Comp Met (CMET)  Result Value Ref Range   Glucose 112 (H) 70 - 99 mg/dL   BUN 12 6 - 24 mg/dL   Creatinine, Ser 8.11 (H) 0.57 - 1.00 mg/dL   eGFR 61 >91 YN/WGN/5.62   BUN/Creatinine Ratio 11 9 - 23   Sodium 139 134 - 144 mmol/L   Potassium 3.9 3.5 - 5.2 mmol/L   Chloride 101 96 - 106 mmol/L   CO2 23 20 - 29 mmol/L   Calcium 10.0 8.7 - 10.2 mg/dL   Total Protein 7.0 6.0 - 8.5 g/dL   Albumin 4.2 3.9 - 4.9 g/dL   Globulin, Total 2.8 1.5 - 4.5 g/dL   Albumin/Globulin Ratio  1.5 1.2 - 2.2   Bilirubin Total 0.2 0.0 - 1.2 mg/dL   Alkaline Phosphatase 22 (L) 44 - 121 IU/L   AST 16 0 - 40 IU/L   ALT 15 0 - 32 IU/L  Lipid Profile  Result Value Ref Range   Cholesterol, Total 198 100 - 199 mg/dL   Triglycerides 98 0 - 149 mg/dL   HDL 55 >16 mg/dL   VLDL Cholesterol Cal 18 5 - 40 mg/dL   LDL Chol Calc (NIH) 109 (H) 0 - 99 mg/dL   Chol/HDL Ratio 3.6 0.0 - 4.4 ratio      Assessment & Plan:   Problem List Items Addressed This Visit       Cardiovascular and Mediastinum   Primary hypertension     Other   Obesity (BMI 35.0-39.9 without comorbidity)   Anxiety   Hyperlipidemia   Other Visit Diagnoses     Annual physical exam    -  Primary   Health maintenance reviewed during visit today.  Labs ordered. Vaccines up to date.  Colonoscopy, mammogram and PAP up to date.        Follow up plan: No follow-ups on file.   LABORATORY TESTING:  - Pap smear: up to date  IMMUNIZATIONS:   - Tdap: Tetanus vaccination  status reviewed: last tetanus booster within 10 years. - Influenza: Up to date - Pneumovax: Not applicable - Prevnar: Not applicable - COVID: Up to date - HPV: Not applicable - Shingrix vaccine: Not applicable  SCREENING: -Mammogram: Up to date  - Colonoscopy: Up to date  - Bone Density: Not applicable  -Hearing Test: Not applicable  -Spirometry: Not applicable   PATIENT COUNSELING:   Advised to take 1 mg of folate supplement per day if capable of pregnancy.   Sexuality: Discussed sexually transmitted diseases, partner selection, use of condoms, avoidance of unintended pregnancy  and contraceptive alternatives.   Advised to avoid cigarette smoking.  I discussed with the patient that most people either abstain from alcohol or drink within safe limits (<=14/week and <=4 drinks/occasion for males, <=7/weeks and <= 3 drinks/occasion for females) and that the risk for alcohol disorders and other health effects rises proportionally with the number of drinks per week and how often a drinker exceeds daily limits.  Discussed cessation/primary prevention of drug use and availability of treatment for abuse.   Diet: Encouraged to adjust caloric intake to maintain  or achieve ideal body weight, to reduce intake of dietary saturated fat and total fat, to limit sodium intake by avoiding high sodium foods and not adding table salt, and to maintain adequate dietary potassium and calcium preferably from fresh fruits, vegetables, and low-fat dairy products.    stressed the importance of regular exercise  Injury prevention: Discussed safety belts, safety helmets, smoke detector, smoking near bedding or upholstery.   Dental health: Discussed importance of regular tooth brushing, flossing, and dental visits.    NEXT PREVENTATIVE PHYSICAL DUE IN 1 YEAR. No follow-ups on file.

## 2022-12-08 LAB — CBC WITH DIFFERENTIAL/PLATELET
Basophils Absolute: 0.1 10*3/uL (ref 0.0–0.2)
Basos: 1 %
EOS (ABSOLUTE): 0.2 10*3/uL (ref 0.0–0.4)
Eos: 3 %
Hematocrit: 45 % (ref 34.0–46.6)
Hemoglobin: 14.3 g/dL (ref 11.1–15.9)
Immature Grans (Abs): 0 10*3/uL (ref 0.0–0.1)
Immature Granulocytes: 1 %
Lymphocytes Absolute: 1.9 10*3/uL (ref 0.7–3.1)
Lymphs: 23 %
MCH: 28.3 pg (ref 26.6–33.0)
MCHC: 31.8 g/dL (ref 31.5–35.7)
MCV: 89 fL (ref 79–97)
Monocytes Absolute: 0.6 10*3/uL (ref 0.1–0.9)
Monocytes: 7 %
Neutrophils Absolute: 5.5 10*3/uL (ref 1.4–7.0)
Neutrophils: 65 %
Platelets: 306 10*3/uL (ref 150–450)
RBC: 5.06 x10E6/uL (ref 3.77–5.28)
RDW: 12.2 % (ref 11.7–15.4)
WBC: 8.4 10*3/uL (ref 3.4–10.8)

## 2022-12-08 LAB — COMPREHENSIVE METABOLIC PANEL
ALT: 16 IU/L (ref 0–32)
AST: 18 IU/L (ref 0–40)
Albumin/Globulin Ratio: 1.8 (ref 1.2–2.2)
Albumin: 4.4 g/dL (ref 3.9–4.9)
Alkaline Phosphatase: 20 IU/L — ABNORMAL LOW (ref 44–121)
BUN/Creatinine Ratio: 11 (ref 9–23)
BUN: 9 mg/dL (ref 6–24)
Bilirubin Total: 0.4 mg/dL (ref 0.0–1.2)
CO2: 22 mmol/L (ref 20–29)
Calcium: 9.2 mg/dL (ref 8.7–10.2)
Chloride: 102 mmol/L (ref 96–106)
Creatinine, Ser: 0.8 mg/dL (ref 0.57–1.00)
Globulin, Total: 2.5 g/dL (ref 1.5–4.5)
Glucose: 92 mg/dL (ref 70–99)
Potassium: 3.9 mmol/L (ref 3.5–5.2)
Sodium: 141 mmol/L (ref 134–144)
Total Protein: 6.9 g/dL (ref 6.0–8.5)
eGFR: 91 mL/min/{1.73_m2} (ref >59)

## 2022-12-08 LAB — TSH: TSH: 1.24 u[IU]/mL (ref 0.450–4.500)

## 2022-12-08 LAB — LIPID PANEL
Chol/HDL Ratio: 3.4 ratio (ref 0.0–4.4)
Cholesterol, Total: 197 mg/dL (ref 100–199)
HDL: 58 mg/dL (ref >39)
LDL Chol Calc (NIH): 120 mg/dL — ABNORMAL HIGH (ref 0–99)
Triglycerides: 106 mg/dL (ref 0–149)
VLDL Cholesterol Cal: 19 mg/dL (ref 5–40)

## 2022-12-08 NOTE — Progress Notes (Signed)
Good Morning. It was nice to see you yesterday.  Your lab work looks good.  Your cholesterol is a little elevated.  I recommend a low fat diet.  No other concerns at this time. Continue with your current medication regimen.  Follow up as discussed.  Please let me know if you have any questions.

## 2023-02-03 IMAGING — MG MM DIGITAL DIAGNOSTIC UNILAT*L* W/ TOMO W/ CAD
6 series · 6 of 14 positions shown · non-contrast
Comparison: Previous exam(s).

CLINICAL DATA: 44-year-old female for slightly delayed six-month
follow-up of LEFT breast calcifications

EXAM:
DIGITAL DIAGNOSTIC UNILATERAL LEFT MAMMOGRAM WITH TOMOSYNTHESIS AND
CAD
TECHNIQUE: Left digital diagnostic mammography and breast tomosynthesis was
performed. The images were evaluated with computer-aided detection.

[L CC]
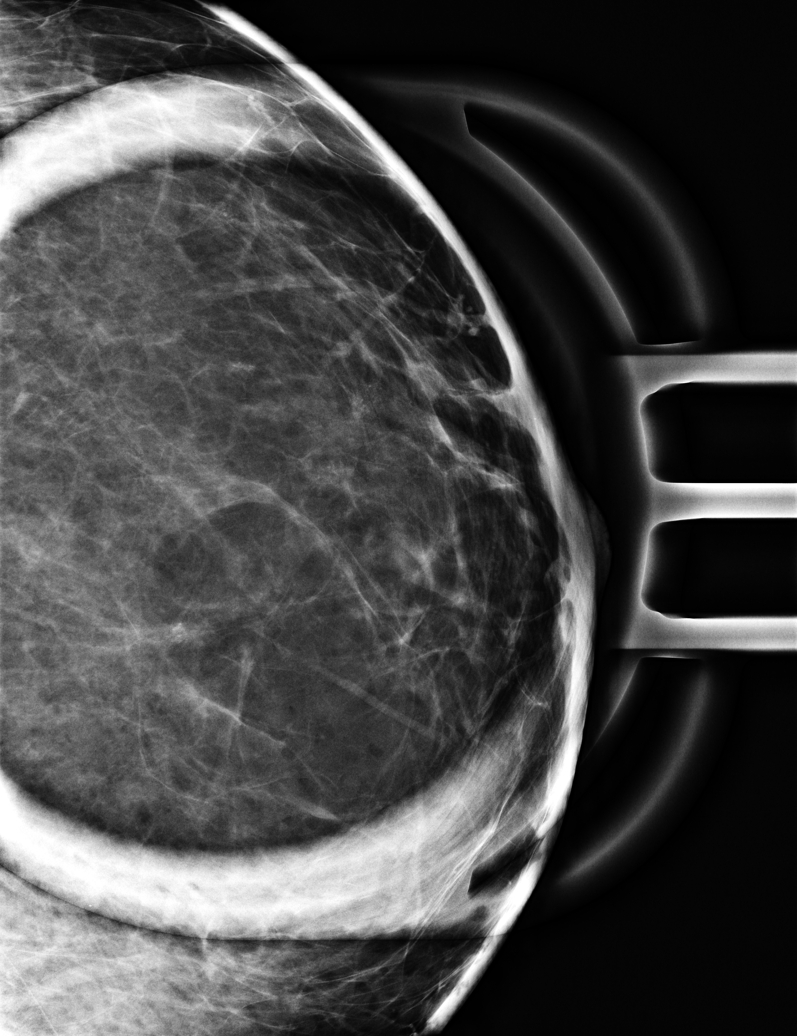

[L ML]
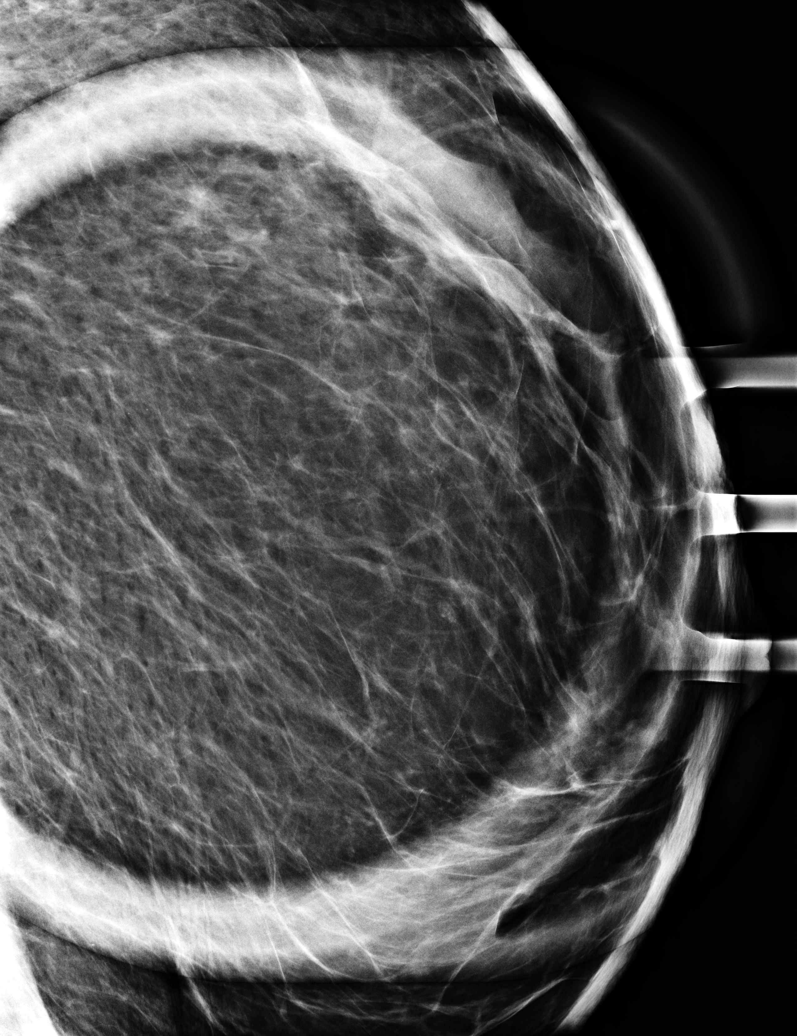

[L CC synth-2D]
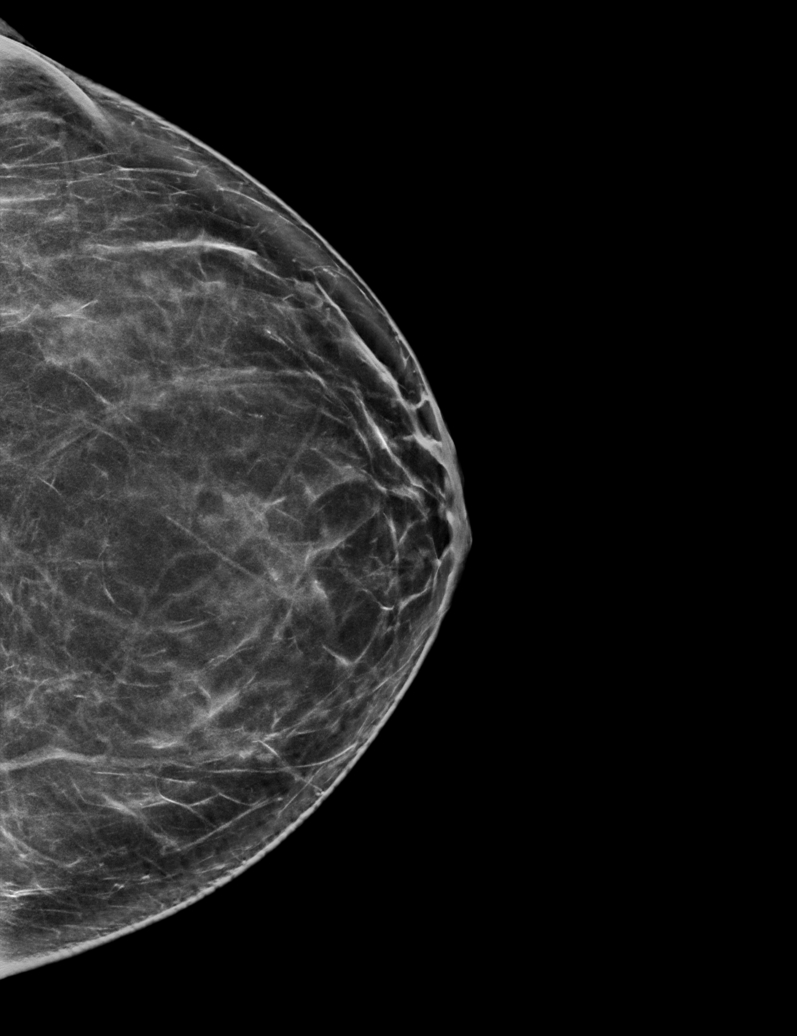

[L MLO synth-2D]
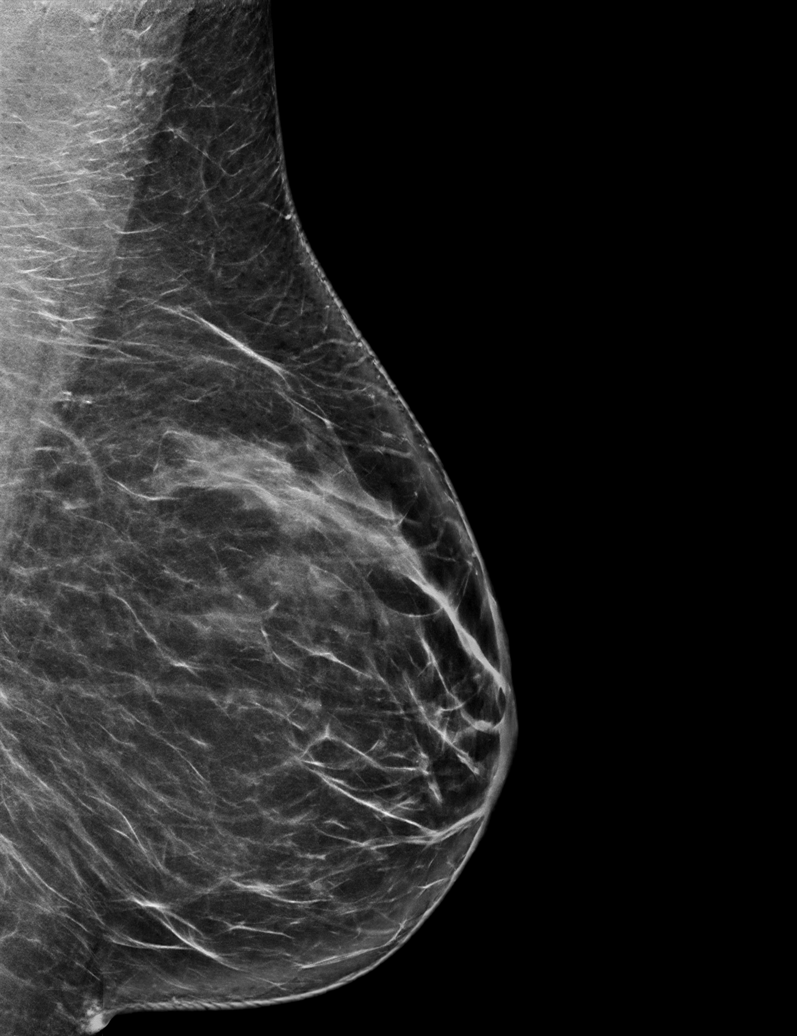

[L CC tomo · tomo slice 37/72.0]
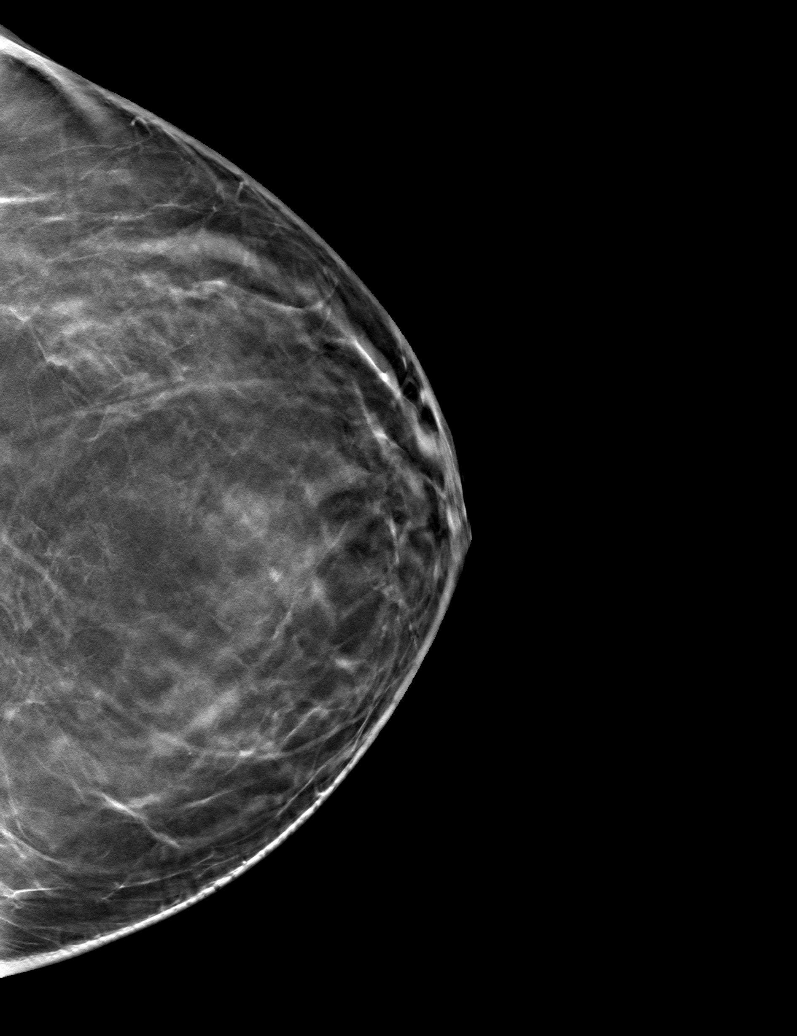

[L MLO tomo · tomo slice 39/77.0]
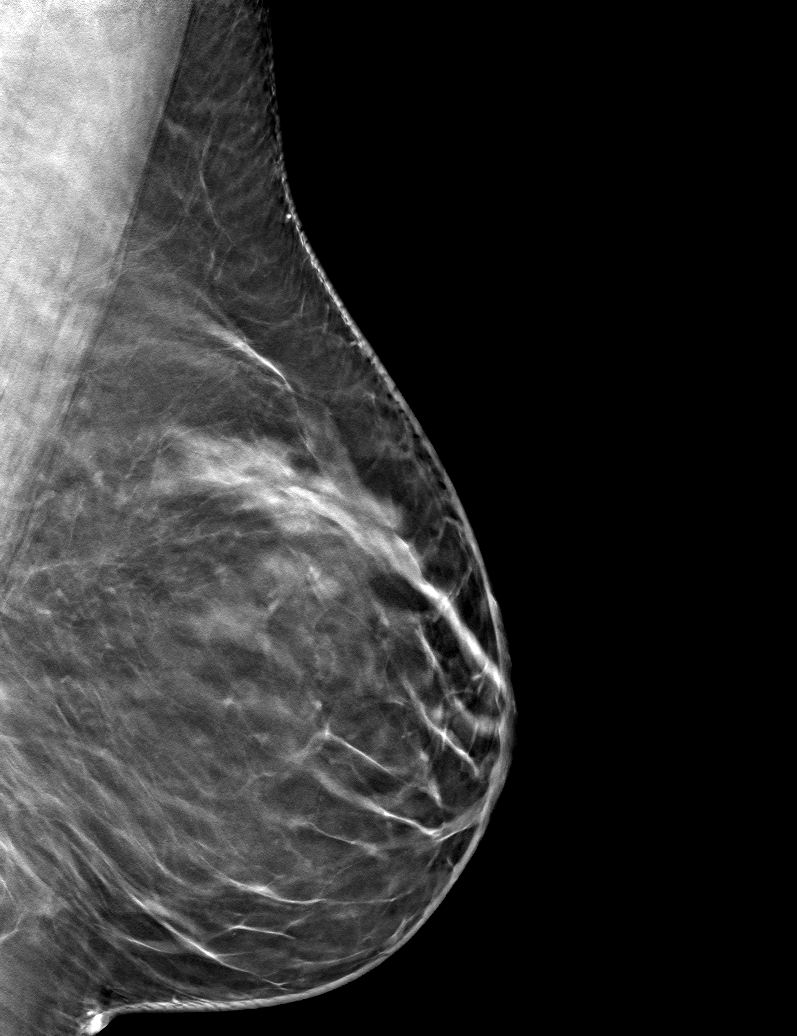

[6 of 14 positions shown; findings below may reference images not displayed]

ACR Breast Density Category b: There are scattered areas of
fibroglandular density.
FINDINGS: Full field and magnification views of the LEFT breast demonstrate 2
stable 0.3 cm group of round calcifications within the OUTER LEFT
breast. No changes are noted.
IMPRESSION: Two stable likely benign groups of calcifications within the OUTER
RIGHT breast. 4 month follow-up is recommended to ensure 1 year
stability.

RECOMMENDATION:
Bilateral diagnostic mammogram with magnification views of the LEFT
breast in 4 months.

I have discussed the findings and recommendations with the patient.
If applicable, a reminder letter will be sent to the patient
regarding the next appointment.

BI-RADS CATEGORY  3: Probably benign.

## 2023-02-28 ENCOUNTER — Other Ambulatory Visit: Payer: Self-pay | Admitting: Nurse Practitioner

## 2023-02-28 NOTE — Telephone Encounter (Signed)
Requested Prescriptions  Pending Prescriptions Disp Refills   gabapentin (NEURONTIN) 300 MG capsule [Pharmacy Med Name: GABAPENTIN 300 MG CAPSULE] 270 capsule 1    Sig: TAKE 1 TAB 3X DAILY     Neurology: Anticonvulsants - gabapentin Passed - 02/28/2023  8:41 AM      Passed - Cr in normal range and within 360 days    Creatinine  Date Value Ref Range Status  04/28/2020 60.2 20.0 - 300.0 mg/dL Final   Creatinine, Ser  Date Value Ref Range Status  12/07/2022 0.80 0.57 - 1.00 mg/dL Final         Passed - Completed PHQ-2 or PHQ-9 in the last 360 days      Passed - Valid encounter within last 12 months    Recent Outpatient Visits           2 months ago Annual physical exam   [place] [facility] Larae Grooms, NP   8 months ago Primary hypertension   [place] [facility] Larae Grooms, NP   1 year ago Anxiety   [place] [facility] Larae Grooms, NP   1 year ago Anxiety   [place] [facility] Larae Grooms, NP   1 year ago Anxiety   [place] [facility] Larae Grooms, NP       Future Appointments             In 3 months Larae Grooms, NP [place] [facility]

## 2023-05-02 ENCOUNTER — Other Ambulatory Visit: Payer: Self-pay | Admitting: Nurse Practitioner

## 2023-05-03 NOTE — Telephone Encounter (Signed)
Requested medication (s) are due for refill today: yes  Requested medication (s) are on the active medication list: yes    Last refill: 11/01/22  #40  0 refills  Future visit scheduled yes 06/07/23  Notes to clinic:Not delegated, please review. Thank you.  Requested Prescriptions  Pending Prescriptions Disp Refills   traMADol (ULTRAM) 50 MG tablet [Pharmacy Med Name: TRAMADOL HCL 50 MG TABLET] 40 tablet     Sig: TAKE 2 TABLETS BY MOUTH EVERY 6 HOURS AS NEEDED     Not Delegated - Analgesics:  Opioid Agonists Failed - 05/02/2023  4:17 PM      Failed - This refill cannot be delegated      Failed - Urine Drug Screen completed in last 360 days      Failed - Valid encounter within last 3 months    Recent Outpatient Visits           4 months ago Annual physical exam   [place] [facility] Larae Grooms, NP   11 months ago Primary hypertension   [place] [facility] Larae Grooms, NP   1 year ago Anxiety   [facility] Larae Grooms, NP   1 year ago Anxiety   [place] [facility] Larae Grooms, NP   1 year ago Anxiety   [place] Larae Grooms, NP       Future Appointments             In 1 month Larae Grooms, NP [place] [facility]

## 2023-06-06 NOTE — Progress Notes (Unsigned)
There were no vitals taken for this visit.   Subjective:    Patient ID: Darlene [name], female    DOB: October 03, 1975, 47 y.o.   MRN: [id number]  HPI: Darlene [name] is a 47 y.o. female  No chief complaint on file.  HYPERTENSION / HYPERLIPIDEMIA Satisfied with current treatment? yes Duration of hypertension: years BP monitoring frequency: not checking BP range:  BP medication side effects: no Past BP meds: valsartan Duration of hyperlipidemia: years Cholesterol medication side effects: no Cholesterol supplements: none Past cholesterol medications: none Medication compliance: excellent compliance Aspirin: no Recent stressors: no Recurrent headaches: no Visual changes: no Palpitations: no Dyspnea: no Chest pain: no Lower extremity edema: no Dizzy/lightheaded: no  Relevant past medical, surgical, family and social history reviewed and updated as indicated. Interim medical history since our last visit reviewed. Allergies and medications reviewed and updated.  Review of Systems  Eyes:  Negative for visual disturbance.  Respiratory:  Negative for cough, chest tightness and shortness of breath.   Cardiovascular:  Negative for chest pain, palpitations and leg swelling.  Neurological:  Negative for dizziness and headaches.    Per HPI unless specifically indicated above     Objective:    There were no vitals taken for this visit.  Wt Readings from Last 3 Encounters:  12/07/22 233 lb 6.4 oz (105.9 kg)  06/07/22 231 lb (104.8 kg)  01/21/22 228 lb 9.6 oz (103.7 kg)    Physical Exam Vitals and nursing note reviewed.  Constitutional:      General: She is not in acute distress.    Appearance: Normal appearance. She is not ill-appearing, toxic-appearing or diaphoretic.  HENT:     Head: Normocephalic.     Right Ear: External ear normal.     Left Ear: External ear normal.     Nose: Nose normal.     Mouth/Throat:     Mouth: Mucous membranes are moist.     Pharynx:  Oropharynx is clear.  Eyes:     General:        Right eye: No discharge.        Left eye: No discharge.     Extraocular Movements: Extraocular movements intact.     Conjunctiva/sclera: Conjunctivae normal.     Pupils: Pupils are equal, round, and reactive to light.  Cardiovascular:     Rate and Rhythm: Normal rate and regular rhythm.     Heart sounds: No murmur heard. Pulmonary:     Effort: Pulmonary effort is normal. No respiratory distress.     Breath sounds: Normal breath sounds. No wheezing or rales.  Musculoskeletal:     Cervical back: Normal range of motion and neck supple.  Skin:    General: Skin is warm and dry.     Capillary Refill: Capillary refill takes less than 2 seconds.  Neurological:     General: No focal deficit present.     Mental Status: She is alert and oriented to person, place, and time. Mental status is at baseline.  Psychiatric:        Mood and Affect: Mood normal.        Behavior: Behavior normal.        Thought Content: Thought content normal.        Judgment: Judgment normal.    Results for orders placed or performed in visit on 12/07/22  Microscopic Examination   Urine  Result Value Ref Range   WBC, UA None seen 0 - 5 /hpf   RBC, Urine  0-2 0 - 2 /hpf   Epithelial Cells (non renal) 0-10 0 - 10 /hpf   Mucus, UA Present (A) Not Estab.   Bacteria, UA None seen None seen/Few  CBC with Differential/Platelet  Result Value Ref Range   WBC 8.4 3.4 - 10.8 x10E3/uL   RBC 5.06 3.77 - 5.28 x10E6/uL   Hemoglobin 14.3 11.1 - 15.9 g/dL   Hematocrit 47.8 29.5 - 46.6 %   MCV 89 79 - 97 fL   MCH 28.3 26.6 - 33.0 pg   MCHC 31.8 31.5 - 35.7 g/dL   RDW 62.1 30.8 - 65.7 %   Platelets 306 150 - 450 x10E3/uL   Neutrophils 65 Not Estab. %   Lymphs 23 Not Estab. %   Monocytes 7 Not Estab. %   Eos 3 Not Estab. %   Basos 1 Not Estab. %   Neutrophils Absolute 5.5 1.4 - 7.0 x10E3/uL   Lymphocytes Absolute 1.9 0.7 - 3.1 x10E3/uL   Monocytes Absolute 0.6 0.1 - 0.9  x10E3/uL   EOS (ABSOLUTE) 0.2 0.0 - 0.4 x10E3/uL   Basophils Absolute 0.1 0.0 - 0.2 x10E3/uL   Immature Granulocytes 1 Not Estab. %   Immature Grans (Abs) 0.0 0.0 - 0.1 x10E3/uL  Comprehensive metabolic panel  Result Value Ref Range   Glucose 92 70 - 99 mg/dL   BUN 9 6 - 24 mg/dL   Creatinine, Ser 8.46 0.57 - 1.00 mg/dL   eGFR 91 >96 EX/BMW/4.13   BUN/Creatinine Ratio 11 9 - 23   Sodium 141 134 - 144 mmol/L   Potassium 3.9 3.5 - 5.2 mmol/L   Chloride 102 96 - 106 mmol/L   CO2 22 20 - 29 mmol/L   Calcium 9.2 8.7 - 10.2 mg/dL   Total Protein 6.9 6.0 - 8.5 g/dL   Albumin 4.4 3.9 - 4.9 g/dL   Globulin, Total 2.5 1.5 - 4.5 g/dL   Albumin/Globulin Ratio 1.8 1.2 - 2.2   Bilirubin Total 0.4 0.0 - 1.2 mg/dL   Alkaline Phosphatase 20 (L) 44 - 121 IU/L   AST 18 0 - 40 IU/L   ALT 16 0 - 32 IU/L  Lipid panel  Result Value Ref Range   Cholesterol, Total 197 100 - 199 mg/dL   Triglycerides 244 0 - 149 mg/dL   HDL 58 >01 mg/dL   VLDL Cholesterol Cal 19 5 - 40 mg/dL   LDL Chol Calc (NIH) 027 (H) 0 - 99 mg/dL   Chol/HDL Ratio 3.4 0.0 - 4.4 ratio  TSH  Result Value Ref Range   TSH 1.240 0.450 - 4.500 uIU/mL  Urinalysis, Routine w reflex microscopic  Result Value Ref Range   Specific Gravity, UA >1.030 (H) 1.005 - 1.030   pH, UA 5.0 5.0 - 7.5   Color, UA Yellow Yellow   Appearance Ur Clear Clear   Leukocytes,UA Negative Negative   Protein,UA 1+ (A) Negative/Trace   Glucose, UA Negative Negative   Ketones, UA Negative Negative   RBC, UA Negative Negative   Bilirubin, UA Negative Negative   Urobilinogen, Ur 1.0 0.2 - 1.0 mg/dL   Nitrite, UA Negative Negative   Microscopic Examination See below:       Assessment & Plan:   Problem List Items Addressed This Visit       Cardiovascular and Mediastinum   Primary hypertension - Primary     Other   Obesity (BMI 35.0-39.9 without comorbidity)   Anxiety   Hyperlipidemia     Follow up plan:  No follow-ups on file.

## 2023-06-07 ENCOUNTER — Ambulatory Visit (INDEPENDENT_AMBULATORY_CARE_PROVIDER_SITE_OTHER): Payer: BC Managed Care – PPO | Admitting: Nurse Practitioner

## 2023-06-07 VITALS — BP 132/82 | HR 71 | Temp 98.1°F | Ht 67.0 in | Wt 239.8 lb

## 2023-06-07 DIAGNOSIS — E669 Obesity, unspecified: Secondary | ICD-10-CM | POA: Diagnosis not present

## 2023-06-07 DIAGNOSIS — F419 Anxiety disorder, unspecified: Secondary | ICD-10-CM

## 2023-06-07 DIAGNOSIS — E785 Hyperlipidemia, unspecified: Secondary | ICD-10-CM

## 2023-06-07 DIAGNOSIS — Z1231 Encounter for screening mammogram for malignant neoplasm of breast: Secondary | ICD-10-CM

## 2023-06-07 DIAGNOSIS — I10 Essential (primary) hypertension: Secondary | ICD-10-CM | POA: Diagnosis not present

## 2023-06-07 MED ORDER — VALSARTAN 40 MG PO TABS: 40.0000 mg | ORAL_TABLET | Freq: Every day | ORAL | 1 refills | Status: DC

## 2023-06-07 MED ORDER — GABAPENTIN 300 MG PO CAPS: ORAL_CAPSULE | ORAL | 1 refills | Status: DC

## 2023-06-07 NOTE — Assessment & Plan Note (Signed)
Recommended eating smaller high protein, low fat meals more frequently and exercising 30 mins a day 5 times a week with a goal of 10-15lb weight loss in the next 3 months.  

## 2023-06-07 NOTE — Assessment & Plan Note (Signed)
Labs ordered at visit today.  Will make recommendations based on lab results.   

## 2023-06-07 NOTE — Assessment & Plan Note (Signed)
Chronic.  Controlled.  Continue with current medication regimen of Valsartan 40mg .  Recommend checking blood pressures at home and bringing log to next visit.  Refills sent today.  Labs ordered today.  Return to clinic in 6 months for reevaluation.  Call sooner if concerns arise.

## 2023-06-08 LAB — COMPREHENSIVE METABOLIC PANEL
ALT: 15 [IU]/L (ref 0–32)
AST: 14 [IU]/L (ref 0–40)
Albumin: 4.1 g/dL (ref 3.9–4.9)
Alkaline Phosphatase: 19 [IU]/L — ABNORMAL LOW (ref 44–121)
BUN/Creatinine Ratio: 15 (ref 9–23)
BUN: 13 mg/dL (ref 6–24)
Bilirubin Total: 0.4 mg/dL (ref 0.0–1.2)
CO2: 22 mmol/L (ref 20–29)
Calcium: 9.1 mg/dL (ref 8.7–10.2)
Chloride: 103 mmol/L (ref 96–106)
Creatinine, Ser: 0.84 mg/dL (ref 0.57–1.00)
Globulin, Total: 2.9 g/dL (ref 1.5–4.5)
Glucose: 88 mg/dL (ref 70–99)
Potassium: 4.2 mmol/L (ref 3.5–5.2)
Sodium: 140 mmol/L (ref 134–144)
Total Protein: 7 g/dL (ref 6.0–8.5)
eGFR: 85 mL/min/{1.73_m2} (ref >59)

## 2023-06-08 LAB — LIPID PANEL
Chol/HDL Ratio: 3.7 ratio (ref 0.0–4.4)
Cholesterol, Total: 208 mg/dL — ABNORMAL HIGH (ref 100–199)
HDL: 56 mg/dL (ref >39)
LDL Chol Calc (NIH): 133 mg/dL — ABNORMAL HIGH (ref 0–99)
Triglycerides: 107 mg/dL (ref 0–149)
VLDL Cholesterol Cal: 19 mg/dL (ref 5–40)

## 2023-07-26 DIAGNOSIS — C801 Malignant (primary) neoplasm, unspecified: Secondary | ICD-10-CM

## 2023-07-26 HISTORY — DX: Malignant (primary) neoplasm, unspecified: C80.1

## 2023-09-10 ENCOUNTER — Other Ambulatory Visit: Payer: Self-pay | Admitting: Nurse Practitioner

## 2023-09-11 NOTE — Telephone Encounter (Signed)
 Requested medications are due for refill today.  no  Requested medications are on the active medications list.  no  Last refill. never  Future visit scheduled.   yes  Notes to clinic.  refill/refusal not delegated.    Requested Prescriptions  Pending Prescriptions Disp Refills   LORazepam (ATIVAN) 0.5 MG tablet [Pharmacy Med Name: LORAZEPAM 0.5 MG TABLET] 14 tablet     Sig: TAKE 1 TABLET BY MOUTH 2 TIMES DAILY AS NEEDED FOR ANXIETY.     Not Delegated - Psychiatry: Anxiolytics/Hypnotics 2 Failed - 09/11/2023  3:43 PM      Failed - This refill cannot be delegated      Failed - Urine Drug Screen completed in last 360 days      Passed - Patient is not pregnant      Passed - Valid encounter within last 6 months    Recent Outpatient Visits           3 months ago Primary hypertension   [facility] Larae Grooms, NP   9 months ago Annual physical exam   [place] [facility] Larae Grooms, NP   1 year ago Primary hypertension   [place] [facility] Larae Grooms, NP   1 year ago Anxiety   [facility] Larae Grooms, NP   1 year ago Anxiety   [place] [facility] Larae Grooms, NP       Future Appointments             In 3 months Larae Grooms, NP [facility]

## 2023-09-14 ENCOUNTER — Telehealth: Payer: BC Managed Care – PPO | Admitting: Nurse Practitioner

## 2023-09-14 ENCOUNTER — Encounter: Payer: Self-pay | Admitting: Nurse Practitioner

## 2023-09-14 VITALS — Ht 67.0 in

## 2023-09-14 DIAGNOSIS — R6889 Other general symptoms and signs: Secondary | ICD-10-CM | POA: Diagnosis not present

## 2023-09-14 DIAGNOSIS — F439 Reaction to severe stress, unspecified: Secondary | ICD-10-CM | POA: Diagnosis not present

## 2023-09-14 MED ORDER — LORAZEPAM 0.5 MG PO TABS: 0.5000 mg | ORAL_TABLET | Freq: Two times a day (BID) | ORAL | 0 refills | Status: AC | PRN

## 2023-09-14 MED ORDER — AZITHROMYCIN 250 MG PO TABS
ORAL_TABLET | ORAL | 0 refills | Status: AC
Start: 2023-09-14 — End: 2023-09-19

## 2023-09-14 MED ORDER — METHYLPREDNISOLONE 4 MG PO TBPK: ORAL_TABLET | ORAL | 0 refills | Status: DC

## 2023-09-14 NOTE — Progress Notes (Signed)
 Ht 5\' 7"  (1.702 m)   BMI 37.56 kg/m    Subjective:    Patient ID: Darlene [name], female    DOB: [birth year], 48 y.o.   MRN: [id number]  HPI: Darlene [name] is a 48 y.o. female  Chief Complaint  Patient presents with   Stress   STRESS Patient seen today via virtual visit due to recent stress that started around February 3.  She got sick on February 3 which made her feel very fatigued and like she had a cold.  Her symptoms started to get better then she started to have mucous stuck in the back of her throat which kept causing her to have the urge to clear her throat.  Feels like symptoms just wont resolve. Felt like her throat was closing and she couldn't breath.  This made her panic and sent her into a spiral.  She took one of the lorazepam she had and that did help some.  Since Saturday she has had a really hard time sleeping.  She was seen for a telehealth visit through her insurance company and they recommended saline solution, flonase and helping her clear the symptoms.  She has used mucinex, flonase, zyrtec, ibuprofen, nasal saline, dayquil.    Relevant past medical, surgical, family and social history reviewed and updated as indicated. Interim medical history since our last visit reviewed. Allergies and medications reviewed and updated.  Review of Systems  HENT:  Positive for congestion and sore throat.   Psychiatric/Behavioral:  The patient is nervous/anxious.     Per HPI unless specifically indicated above     Objective:    Ht 5\' 7"  (1.702 m)   BMI 37.56 kg/m   Wt Readings from Last 3 Encounters:  06/07/23 239 lb 12.8 oz (108.8 kg)  12/07/22 233 lb 6.4 oz (105.9 kg)  06/07/22 231 lb (104.8 kg)    Physical Exam Vitals and nursing note reviewed.  Constitutional:      General: She is not in acute distress.    Appearance: She is not ill-appearing.  HENT:     Head: Normocephalic.     Right Ear: Hearing normal.     Left Ear: Hearing normal.     Nose: Nose  normal.  Pulmonary:     Effort: Pulmonary effort is normal. No respiratory distress.  Neurological:     Mental Status: She is alert.  Psychiatric:        Mood and Affect: Mood normal.        Behavior: Behavior normal.        Thought Content: Thought content normal.        Judgment: Judgment normal.     Results for orders placed or performed in visit on 06/07/23  Comp Met (CMET)   Collection Time: 06/07/23  9:05 AM  Result Value Ref Range   Glucose 88 70 - 99 mg/dL   BUN 13 6 - 24 mg/dL   Creatinine, Ser 4.09 0.57 - 1.00 mg/dL   eGFR 85 >81 XB/JYN/8.29   BUN/Creatinine Ratio 15 9 - 23   Sodium 140 134 - 144 mmol/L   Potassium 4.2 3.5 - 5.2 mmol/L   Chloride 103 96 - 106 mmol/L   CO2 22 20 - 29 mmol/L   Calcium 9.1 8.7 - 10.2 mg/dL   Total Protein 7.0 6.0 - 8.5 g/dL   Albumin 4.1 3.9 - 4.9 g/dL   Globulin, Total 2.9 1.5 - 4.5 g/dL   Bilirubin Total 0.4 0.0 - 1.2 mg/dL  Alkaline Phosphatase 19 (L) 44 - 121 IU/L   AST 14 0 - 40 IU/L   ALT 15 0 - 32 IU/L  Lipid Profile   Collection Time: 06/07/23  9:05 AM  Result Value Ref Range   Cholesterol, Total 208 (H) 100 - 199 mg/dL   Triglycerides 161 0 - 149 mg/dL   HDL 56 >09 mg/dL   VLDL Cholesterol Cal 19 5 - 40 mg/dL   LDL Chol Calc (NIH) 604 (H) 0 - 99 mg/dL   Chol/HDL Ratio 3.7 0.0 - 4.4 ratio      Assessment & Plan:   Problem List Items Addressed This Visit   None Visit Diagnoses       Throat congestion    -  Primary   will treat with medrol dose pak and azithromycin. Recommend Neti Pot, zyrtec, and mucinex. Can drink warm tea with honey. Follow up in 1week.     Stress       Will treat with Lorazepam for PRN use.        Follow up plan: Return in about 1 week (around 09/21/2023) for Stress.   This visit was completed via MyChart due to the restrictions of the COVID-19 pandemic. All issues as above were discussed and addressed. Physical exam was done as above through visual confirmation on MyChart. If it was  felt that the patient should be evaluated in the office, they were directed there. The patient verbally consented to this visit. Location of the patient: Home Location of the provider: Office Those involved with this call:  Provider: Larae Grooms, NP CMA: Sunday Shams, CMA Front Desk/Registration: Servando Snare This encounter was conducted via video.  I spent 20 dedicated to the care of this patient on the date of this encounter to include previsit review of symptoms, plan of care and follow up, face to face time with the patient, and post visit ordering of testing.

## 2023-09-21 ENCOUNTER — Encounter: Payer: Self-pay | Admitting: Nurse Practitioner

## 2023-10-11 ENCOUNTER — Ambulatory Visit
Admission: RE | Admit: 2023-10-11 | Discharge: 2023-10-11 | Disposition: A | Discharge status: Home / Self Care | Source: Ambulatory Visit | Attending: Nurse Practitioner | Admitting: Nurse Practitioner

## 2023-10-11 DIAGNOSIS — Z1231 Encounter for screening mammogram for malignant neoplasm of breast: Secondary | ICD-10-CM | POA: Diagnosis present

## 2023-10-12 ENCOUNTER — Other Ambulatory Visit: Payer: Self-pay | Admitting: Nurse Practitioner

## 2023-10-12 DIAGNOSIS — R928 Other abnormal and inconclusive findings on diagnostic imaging of breast: Secondary | ICD-10-CM

## 2023-10-18 ENCOUNTER — Ambulatory Visit
Admission: RE | Admit: 2023-10-18 | Discharge: 2023-10-18 | Disposition: A | Discharge status: Home / Self Care | Source: Ambulatory Visit | Attending: Nurse Practitioner | Admitting: Nurse Practitioner

## 2023-10-18 DIAGNOSIS — R928 Other abnormal and inconclusive findings on diagnostic imaging of breast: Secondary | ICD-10-CM | POA: Diagnosis present

## 2023-10-19 ENCOUNTER — Other Ambulatory Visit: Payer: Self-pay | Admitting: Nurse Practitioner

## 2023-10-19 DIAGNOSIS — R928 Other abnormal and inconclusive findings on diagnostic imaging of breast: Secondary | ICD-10-CM

## 2023-10-23 ENCOUNTER — Ambulatory Visit
Admission: RE | Admit: 2023-10-23 | Discharge: 2023-10-23 | Disposition: A | Discharge status: Home / Self Care | Source: Ambulatory Visit | Attending: Nurse Practitioner | Admitting: Nurse Practitioner

## 2023-10-23 DIAGNOSIS — D0512 Intraductal carcinoma in situ of left breast: Secondary | ICD-10-CM | POA: Insufficient documentation

## 2023-10-23 DIAGNOSIS — R928 Other abnormal and inconclusive findings on diagnostic imaging of breast: Secondary | ICD-10-CM

## 2023-10-23 HISTORY — PX: BREAST BIOPSY: SHX20

## 2023-10-23 MED ORDER — LIDOCAINE-EPINEPHRINE 1 %-1:100000 IJ SOLN
10.0000 mL | Freq: Once | INTRAMUSCULAR | Status: AC
  Administered 2023-10-23: 10 mL
  Filled 2023-10-23: qty 10

## 2023-10-23 MED ORDER — LIDOCAINE-EPINEPHRINE 1 %-1:100000 IJ SOLN
10.0000 mL | Freq: Once | INTRAMUSCULAR | Status: AC
Start: 2023-10-23 — End: 2023-10-23
  Administered 2023-10-23: 10 mL
  Filled 2023-10-23: qty 10

## 2023-10-23 MED ORDER — LIDOCAINE HCL 1 % IJ SOLN
5.0000 mL | Freq: Once | INTRAMUSCULAR | Status: AC
  Administered 2023-10-23: 5 mL
  Filled 2023-10-23: qty 5

## 2023-10-23 MED ORDER — LIDOCAINE 1 % OPTIME INJ - NO CHARGE
5.0000 mL | Freq: Once | INTRAMUSCULAR | Status: AC
  Administered 2023-10-23: 5 mL
  Filled 2023-10-23: qty 6

## 2023-10-24 ENCOUNTER — Encounter: Payer: Self-pay | Admitting: *Deleted

## 2023-10-24 DIAGNOSIS — D0512 Intraductal carcinoma in situ of left breast: Secondary | ICD-10-CM

## 2023-10-24 LAB — SURGICAL PATHOLOGY

## 2023-10-24 NOTE — Progress Notes (Signed)
 Received referral for newly diagnosed breast cancer from [facility].  Navigation initiated.  She will see Dr. Smith Robert on Friday 4/4.   Referral placed to [facility] per patient preference.   Their office will call with the appt.

## 2023-10-27 ENCOUNTER — Inpatient Hospital Stay: Attending: Oncology | Admitting: Oncology

## 2023-10-27 ENCOUNTER — Encounter: Payer: Self-pay | Admitting: Oncology

## 2023-10-27 ENCOUNTER — Inpatient Hospital Stay

## 2023-10-27 ENCOUNTER — Encounter: Payer: Self-pay | Admitting: *Deleted

## 2023-10-27 VITALS — BP 112/75 | HR 83 | Temp 98.8°F | Resp 19 | Ht 67.0 in | Wt 239.5 lb

## 2023-10-27 DIAGNOSIS — Z170 Estrogen receptor positive status [ER+]: Secondary | ICD-10-CM | POA: Insufficient documentation

## 2023-10-27 DIAGNOSIS — D0512 Intraductal carcinoma in situ of left breast: Secondary | ICD-10-CM | POA: Insufficient documentation

## 2023-10-27 DIAGNOSIS — E785 Hyperlipidemia, unspecified: Secondary | ICD-10-CM | POA: Insufficient documentation

## 2023-10-27 DIAGNOSIS — I10 Essential (primary) hypertension: Secondary | ICD-10-CM | POA: Insufficient documentation

## 2023-10-27 NOTE — Progress Notes (Signed)
Accompanied patient  to initial medical oncology appointment.   Reviewed Breast Cancer treatment handbook.   Care plan summary given to patient.   Reviewed outreach programs and cancer center services.

## 2023-10-27 NOTE — Progress Notes (Signed)
 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  This was followed by diagnostic mammogram which showed 2 groups of indeterminate calcifications in the left breast 16 mm and 5 mm respectively for an estimated span of 28 mm.  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  ER testing deferred to excisional specimen.  Patient is G0, P0 presently on estrogen implant for birth control.  No personal or family history of breast cancer.  ECOG PS- 0  Pain scale- 0   Review of systems- Review of Systems  Constitutional:  Negative for chills, fever, malaise/fatigue and weight loss.  HENT:  Negative for congestion, ear discharge and nosebleeds.   Eyes:  Negative for blurred vision.  Respiratory:  Negative for cough, hemoptysis, sputum production, shortness of breath and wheezing.   Cardiovascular:  Negative for chest pain, palpitations, orthopnea and claudication.  Gastrointestinal:  Negative for abdominal pain, blood in stool, constipation, diarrhea, heartburn, melena, nausea and vomiting.  Genitourinary:  Negative for dysuria, flank pain, frequency, hematuria and urgency.  Musculoskeletal:  Negative for back pain, joint pain and myalgias.  Skin:  Negative for rash.  Neurological:  Negative for dizziness, tingling, focal  weakness, seizures, weakness and headaches.  Endo/Heme/Allergies:  Does not bruise/bleed easily.  Psychiatric/Behavioral:  Negative for depression and suicidal ideas. The patient does not have insomnia.     No Known Allergies  Patient Active Problem List   Diagnosis Date Noted   Hyperlipidemia 05/18/2022   Anxiety 12/21/2021   Family history of colon cancer    Rectal polyp    Abnormal uterine bleeding (AUB) 04/15/2021   Primary hypertension 01/05/2021   Chronic pain of right ankle 03/30/2018   Obesity (BMI 35.0-39.9 without comorbidity) 03/30/2018   Bursitis of left hip 02/01/2017     Past Medical History:  Diagnosis Date   Ankle pain    Hypertension      Past Surgical History:  Procedure Laterality Date   ABLATION     uterus   ACHILLES TENDON REPAIR Right 2016   ACHILLES TENDON REPAIR Right 2017   BREAST BIOPSY Left 10/23/2023   Stereo bx, X-clip posterior, path pending   BREAST BIOPSY Left 10/23/2023   Stereo bx, coil clip anterior, path pending   BREAST BIOPSY Left 10/23/2023   MM LT BREAST BX W LOC DEV 1ST LESION IMAGE BX SPEC STEREO GUIDE 10/23/2023 [facility]   BREAST BIOPSY Left 10/23/2023   MM LT BREAST BX W LOC DEV EA AD LESION IMG BX SPEC STEREO GUIDE 10/23/2023 [facility]   COLONOSCOPY WITH PROPOFOL N/A 06/11/2021   Procedure: COLONOSCOPY WITH PROPOFOL;  Surgeon: Pasty Spillers, MD;  Location: [facility];  Service: Endoscopy;  Laterality: N/A;    Social History   Socioeconomic History   Marital status: Married    Spouse name: Not on file   Number of children: Not on file   Years of education: Not on file  Highest education level: Bachelor's degree (e.g., BA, AB, BS)  Occupational History   Not on file  Tobacco Use   Smoking status: Never   Smokeless tobacco: Never  Vaping Use   Vaping status: Never Used  Substance and Sexual Activity   Alcohol use: Yes    Comment: occ   Drug use: Never   Sexual activity: Yes    Partners:  Male    Birth control/protection: Implant  Other Topics Concern   Not on file  Social History Narrative   Patient lives with husband. Recently relocated to [facility] from [place], and prior to that lived in [place]. Patient is from [place] and her family continues to reside there.    Social Drivers of Corporate investment banker Strain: Low Risk  (09/13/2023)   Overall Financial Resource Strain (CARDIA)    Difficulty of Paying Living Expenses: Not hard at all  Food Insecurity: No Food Insecurity (09/13/2023)   Hunger Vital Sign    Worried About Running Out of Food in the Last Year: Never true    Ran Out of Food in the Last Year: Never true  Transportation Needs: No Transportation Needs (09/13/2023)   PRAPARE - Administrator, Civil Service (Medical): No    Lack of Transportation (Non-Medical): No  Physical Activity: Sufficiently Active (09/13/2023)   Exercise Vital Sign    Days of Exercise per Week: 3 days    Minutes of Exercise per Session: 60 min  Stress: Stress Concern Present (09/13/2023)   [employer] of Occupational Health - Occupational Stress Questionnaire    Feeling of Stress : To some extent  Social Connections: Unknown (09/13/2023)   Social Connection and Isolation Panel [NHANES]    Frequency of Communication with Friends and Family: Twice a week    Frequency of Social Gatherings with Friends and Family: Patient declined    Attends Religious Services: Never    Database administrator or Organizations: Yes    Attends Engineer, structural: More than 4 times per year    Marital Status: Married  Catering manager Violence: Not on file     Family History  Problem Relation Age of Onset   Diabetes Mother    Thyroid disease Mother    Colon cancer Father    Diabetes Father    Heart disease Father    Diabetes Maternal Grandmother    Breast cancer Neg Hx      Current Outpatient Medications:    etonogestrel (NEXPLANON) 68 MG IMPL implant, 1 each by Subdermal route  once., Disp: , Rfl:    gabapentin (NEURONTIN) 300 MG capsule, Take 1 tab 3x daily, Disp: 270 capsule, Rfl: 1   LORazepam (ATIVAN) 0.5 MG tablet, Take 1 tablet (0.5 mg total) by mouth 2 (two) times daily as needed for anxiety., Disp: 30 tablet, Rfl: 0   methylPREDNISolone (MEDROL DOSEPAK) 4 MG TBPK tablet, Take as directed (Patient not taking: Reported on 10/27/2023), Disp: 21 tablet, Rfl: 0   traMADol (ULTRAM) 50 MG tablet, TAKE 2 TABLETS BY MOUTH EVERY 6 HOURS AS NEEDED, Disp: 40 tablet, Rfl: 0   valsartan (DIOVAN) 40 MG tablet, Take 1 tablet (40 mg total) by mouth daily., Disp: 90 tablet, Rfl: 1   Physical exam: There were no vitals filed for this visit. Physical Exam HENT:     Head: Normocephalic and atraumatic.  Eyes:     Pupils: Pupils are equal, round, and reactive to light.  Cardiovascular:     Rate and  Rhythm: Normal rate and regular rhythm.     Heart sounds: Normal heart sounds.  Pulmonary:     Effort: Pulmonary effort is normal.     Breath sounds: Normal breath sounds.  Abdominal:     General: Bowel sounds are normal.     Palpations: Abdomen is soft.  Musculoskeletal:     Cervical back: Normal range of motion.  Skin:    General: Skin is warm and dry.  Neurological:     Mental Status: She is alert and oriented to person, place, and time.          Latest Ref Rng & Units 06/07/2023    9:05 AM  CMP  Glucose 70 - 99 mg/dL 88   BUN 6 - 24 mg/dL 13   Creatinine 0.34 - 1.00 mg/dL 7.42   Sodium 595 - 638 mmol/L 140   Potassium 3.5 - 5.2 mmol/L 4.2   Chloride 96 - 106 mmol/L 103   CO2 20 - 29 mmol/L 22   Calcium 8.7 - 10.2 mg/dL 9.1   Total Protein 6.0 - 8.5 g/dL 7.0   Total Bilirubin 0.0 - 1.2 mg/dL 0.4   Alkaline Phos 44 - 121 IU/L 19   AST 0 - 40 IU/L 14   ALT 0 - 32 IU/L 15       Latest Ref Rng & Units 12/07/2022    9:22 AM  CBC  WBC 3.4 - 10.8 x10E3/uL 8.4   Hemoglobin 11.1 - 15.9 g/dL 75.6   Hematocrit 43.3 - 46.6 % 45.0   Platelets 150 - 450 x10E3/uL 306      No images are attached to the encounter.  MM LT BREAST BX W LOC DEV 1ST LESION IMAGE BX SPEC STEREO GUIDE Addendum Date: 10/24/2023 ADDENDUM REPORT: 10/24/2023 13:49 ADDENDUM: PATHOLOGY revealed: Site 1. 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 Pathology results are CONCORDANT with imaging findings, per Dr. Amie Portland. PATHOLOGY revealed: Site 2. Breast, left, needle core biopsy, calcifications lower outer quadrant, posterior, x clip : - DUCTAL CARCINOMA IN SITU (DCIS) INTERMEDIATE GRADE WITH CALCIFICATIONS. Pathology results are CONCORDANT with imaging findings, per Dr. Amie Portland. Pathology results and recommendations below were discussed with patient by telephone on 10/24/2023 by Randa Lynn RN. Patient reported biopsy site within normal limits with slight tenderness at the site. Post biopsy care instructions were reviewed, questions were answered and my direct phone number was provided to patient. Patient was instructed to call [facility] if any concerns or questions arise related to the biopsy. RECOMMENDATIONS: 1. Surgical and oncological consultation. Request for surgical and oncological consultation relayed to Irving Shows RN at [facility] by Randa Lynn RN on 10/24/2023. 2. Recommend pretreatment bilateral breast MRI with and without contrast given patient's relatively young age and to evaluate extent of breast disease given high grade DCIS. Pathology results reported by Randa Lynn RN on 10/24/2023. Electronically Signed   By: Amie Portland M.D.   On: 10/24/2023 13:49   Result Date: 10/24/2023 CLINICAL DATA:  Patient presents for stereotactic core needle biopsy left breast calcifications. Two stereotactic core needle biopsies were performed. EXAM: LEFT BREAST STEREOTACTIC CORE NEEDLE BIOPSY: TWO BREAST STEREOTACTIC NEEDLE  BIOPSIES. COMPARISON:  Previous exam(s). FINDINGS: The patient and I discussed the procedure of stereotactic-guided biopsy including benefits and alternatives. We discussed the high likelihood of a successful procedure. We discussed the risks  of the procedure including infection, bleeding, tissue injury, clip migration, and inadequate sampling. Informed written consent was given. The usual time out protocol was performed immediately prior to the procedure. Biopsy #1: Lower outer quadrant calcifications, anterior. Using sterile technique and 1% Lidocaine as local anesthetic, under stereotactic guidance, a 9 gauge vacuum assisted device was used to perform core needle biopsy of calcifications in the lower outer quadrant the left breast using a lateral approach. Specimen radiograph was performed showing calcifications for which biopsy was performed. Specimens with calcifications are identified for pathology. Lesion quadrant: Lower outer quadrant At the conclusion of the procedure, a coil shaped tissue marker clip was deployed into the biopsy cavity. Biopsy #2: Lower outer quadrant calcifications, posterior. Using sterile technique and 1% Lidocaine as local anesthetic, under stereotactic guidance, a 9 gauge vacuum assisted device was used to perform core needle biopsy of calcifications in the lower outer quadrant of the left breast using a lateral approach. Specimen radiograph was performed showing calcifications for which biopsy was performed. Specimens with calcifications are identified for pathology. Lesion quadrant: Lower outer quadrant At the conclusion of the procedure, an X shaped tissue marker clip was deployed into the biopsy cavity. Follow-up 2-view mammogram was performed and dictated separately. IMPRESSION: Stereotactic-guided biopsy of the anterior and posterior extent of lower outer quadrant left breast calcifications. No apparent complications. Electronically Signed: By: Amie Portland M.D. On: 10/23/2023  08:48   MM LT BREAST BX W LOC DEV EA AD LESION IMG BX SPEC STEREO GUIDE Addendum Date: 10/24/2023 ADDENDUM REPORT: 10/24/2023 13:49 ADDENDUM: PATHOLOGY revealed: Site 1. 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 Pathology results are CONCORDANT with imaging findings, per Dr. Amie Portland. PATHOLOGY revealed: Site 2. Breast, left, needle core biopsy, calcifications lower outer quadrant, posterior, x clip : - DUCTAL CARCINOMA IN SITU (DCIS) INTERMEDIATE GRADE WITH CALCIFICATIONS. Pathology results are CONCORDANT with imaging findings, per Dr. Amie Portland. Pathology results and recommendations below were discussed with patient by telephone on 10/24/2023 by Randa Lynn RN. Patient reported biopsy site within normal limits with slight tenderness at the site. Post biopsy care instructions were reviewed, questions were answered and my direct phone number was provided to patient. Patient was instructed to call [facility] if any concerns or questions arise related to the biopsy. RECOMMENDATIONS: 1. Surgical and oncological consultation. Request for surgical and oncological consultation relayed to Irving Shows RN at [facility] by Randa Lynn RN on 10/24/2023. 2. Recommend pretreatment bilateral breast MRI with and without contrast given patient's relatively young age and to evaluate extent of breast disease given high grade DCIS. Pathology results reported by Randa Lynn RN on 10/24/2023. Electronically Signed   By: Amie Portland M.D.   On: 10/24/2023 13:49   Result Date: 10/24/2023 CLINICAL DATA:  Patient presents for stereotactic core needle biopsy left breast calcifications. Two stereotactic core needle biopsies were performed. EXAM: LEFT BREAST STEREOTACTIC CORE NEEDLE BIOPSY: TWO BREAST STEREOTACTIC NEEDLE BIOPSIES. COMPARISON:  Previous exam(s).  FINDINGS: The patient and I discussed the procedure of stereotactic-guided biopsy including benefits and alternatives. We discussed the high likelihood of a successful procedure. We discussed the risks of the procedure including infection, bleeding, tissue injury, clip migration, and inadequate sampling. Informed written consent was given. The usual time out protocol was performed immediately prior to the procedure. Biopsy #1: Lower outer quadrant calcifications, anterior. Using sterile technique  and 1% Lidocaine as local anesthetic, under stereotactic guidance, a 9 gauge vacuum assisted device was used to perform core needle biopsy of calcifications in the lower outer quadrant the left breast using a lateral approach. Specimen radiograph was performed showing calcifications for which biopsy was performed. Specimens with calcifications are identified for pathology. Lesion quadrant: Lower outer quadrant At the conclusion of the procedure, a coil shaped tissue marker clip was deployed into the biopsy cavity. Biopsy #2: Lower outer quadrant calcifications, posterior. Using sterile technique and 1% Lidocaine as local anesthetic, under stereotactic guidance, a 9 gauge vacuum assisted device was used to perform core needle biopsy of calcifications in the lower outer quadrant of the left breast using a lateral approach. Specimen radiograph was performed showing calcifications for which biopsy was performed. Specimens with calcifications are identified for pathology. Lesion quadrant: Lower outer quadrant At the conclusion of the procedure, an X shaped tissue marker clip was deployed into the biopsy cavity. Follow-up 2-view mammogram was performed and dictated separately. IMPRESSION: Stereotactic-guided biopsy of the anterior and posterior extent of lower outer quadrant left breast calcifications. No apparent complications. Electronically Signed: By: Amie Portland M.D. On: 10/23/2023 08:48   MM CLIP PLACEMENT LEFT Result  Date: 10/23/2023 CLINICAL DATA:  Assess post biopsy marker clip placements following stereotactic core needle biopsy of left breast calcifications. EXAM: 3D DIAGNOSTIC LEFT MAMMOGRAM POST STEREOTACTIC BIOPSY COMPARISON:  Previous exam(s). ACR Breast Density Category b: There are scattered areas of fibroglandular density. FINDINGS: 3D Mammographic images were obtained following stereotactic guided biopsy of the anterior-posterior extents of left breast calcifications. The coil shaped biopsy clip lies in the expected location of the more anterior calcifications and the X shaped post biopsy marker clip lies in the expected location of the more posterior calcifications. IMPRESSION: Appropriate positioning of the coil and ribbon shaped biopsy marking clips at the site of biopsy in the lower outer left breast as detailed. Final Assessment: Post Procedure Mammograms for Marker Placement Electronically Signed   By: Amie Portland M.D.   On: 10/23/2023 08:59   MM 3D DIAGNOSTIC MAMMOGRAM UNILATERAL LEFT BREAST Result Date: 10/18/2023 CLINICAL DATA:  LEFT breast callback for calcifications EXAM: DIGITAL DIAGNOSTIC UNILATERAL LEFT MAMMOGRAM WITH TOMOSYNTHESIS AND CAD TECHNIQUE: Left digital diagnostic mammography and breast tomosynthesis was performed. The images were evaluated with computer-aided detection. COMPARISON:  Previous exam(s). ACR Breast Density Category b: There are scattered areas of fibroglandular density. FINDINGS: Spot magnification views of the LEFT breast demonstrate a group of amorphous calcifications in a linear distribution in the lower slightly outer breast at anterior to middle depth. The span approximately 16 mm and are best seen on CC view. These are not definitively stable in comparison to prior mammograms. In addition there is a 5 mm group punctate and amorphous calcifications in the lower breast at anterior depth. This is not definitively stable in comparison to prior. The 2 areas span  approximately 28 mm. Spot magnification views demonstrate a group of layering calcifications in the upper outer breast at anterior to middle depth, consistent with a benign etiology. IMPRESSION: 1. There are 2 groups of indeterminate calcifications in the LEFT breast, measuring 16 and 5 mm respectively for an estimated span of 28 mm. Recommend stereotactic guided biopsy for definitive characterization. RECOMMENDATION: LEFT breast stereotactic guided biopsy x2 I have discussed the findings and recommendations with the patient. The biopsy procedure was discussed with the patient and questions were answered. Patient expressed their understanding of the biopsy recommendation. Patient will be scheduled  for biopsy at her earliest convenience by the schedulers. Ordering provider will be notified. If applicable, a reminder letter will be sent to the patient regarding the next appointment. BI-RADS CATEGORY  4: Suspicious. Electronically Signed   By: Meda Klinefelter M.D.   On: 10/18/2023 15:47   MM 3D SCREENING MAMMOGRAM BILATERAL BREAST Result Date: 10/12/2023 CLINICAL DATA:  Screening. History of benign LEFT breast milk of calcium. EXAM: DIGITAL SCREENING BILATERAL MAMMOGRAM WITH TOMOSYNTHESIS AND CAD TECHNIQUE: Bilateral screening digital craniocaudal and mediolateral oblique mammograms were obtained. Bilateral screening digital breast tomosynthesis was performed. The images were evaluated with computer-aided detection. COMPARISON:  Previous exam(s). ACR Breast Density Category b: There are scattered areas of fibroglandular density. FINDINGS: In the left breast, calcifications warrant further evaluation. In the right breast, no findings suspicious for malignancy. IMPRESSION: Further evaluation is suggested for calcifications in the left breast. RECOMMENDATION: Diagnostic mammogram of the left breast. (Code:[id number]) The patient will be contacted regarding the findings, and additional imaging will be scheduled.  BI-RADS CATEGORY  0: Incomplete: Need additional imaging evaluation. Electronically Signed   By: Meda Klinefelter M.D.   On: 10/12/2023 15:22    Assessment and plan- Patient is a 48 y.o. female referred for left breast DCIS  Discussed that standard of care for DCIS that is ER positive would be upfront lumpectomy followed by consideration for adjuvant radiation.  Ideally we are looking for 2 mm negative margins at the time of DCIS.  If margins are close or positive consideration will need to be given for repeat surgery versus radiation boost.  In rare situations invasive cancer may be found at the time of DCIS.  Purely for DCIS there is no role for adjuvant chemotherapy.  Will discuss the role for endocrine therapy down the line if she is found to be ER positive on final pathology.  Treatment will be given with curative intent.  I will also reach out to genetic counseling and see if she would qualify for and we will let her know if she would like to pursue genetic testing.  Thank you for this kind referral and the opportunity to participate in the care of this patient   Visit Diagnosis 1. Ductal carcinoma in situ (DCIS) of left breast     Dr. Owens Shark, MD, MPH [facility] at [facility] [phone] 10/27/2023

## 2023-10-28 ENCOUNTER — Encounter: Payer: Self-pay | Admitting: Oncology

## 2023-10-30 ENCOUNTER — Encounter: Payer: Self-pay | Admitting: Surgery

## 2023-10-30 ENCOUNTER — Telehealth: Payer: Self-pay | Admitting: Surgery

## 2023-10-30 ENCOUNTER — Other Ambulatory Visit: Payer: Self-pay | Admitting: Nurse Practitioner

## 2023-10-30 ENCOUNTER — Ambulatory Visit (INDEPENDENT_AMBULATORY_CARE_PROVIDER_SITE_OTHER): Payer: Self-pay | Admitting: Surgery

## 2023-10-30 ENCOUNTER — Encounter: Payer: Self-pay | Admitting: *Deleted

## 2023-10-30 ENCOUNTER — Encounter: Payer: Self-pay | Admitting: Nurse Practitioner

## 2023-10-30 VITALS — BP 132/82 | HR 77 | Temp 99.0°F | Ht 67.0 in | Wt 239.8 lb

## 2023-10-30 DIAGNOSIS — D0512 Intraductal carcinoma in situ of left breast: Secondary | ICD-10-CM | POA: Diagnosis not present

## 2023-10-30 NOTE — Progress Notes (Signed)
 10/30/2023  Reason for Visit:  Left breast DCIS  Requesting Provider:  Larae Grooms, NP  History of Present Illness: Darlene [name] is a 48 y.o. 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  The patient had a screening mammogram on 10/11/23 which showed calcifications in the left breast.  Diagnostic mammogram confirmed two groups of calcifications of 16 mm and 5 mm each, spanning a total area of 28 mm.  She had a biopsy of these two sites on 10/23/23 which showed DCIS with moderate to high grade comedonecrosis.  She has seen Dr. Smith Robert with Oncology and she discussed with her recommendations for lumpectomy and radiation and possible endocrine therapy based on final ER testing.  The patient does not have family history of breast cancer.  She had not had any prior biopsies.  She is G0, P0, and is currently has Nexplanon implant for birth control.  She denies any palpable masses, skin changes, or nipple changes/drainage.  She does report some tenderness and bruising after the biopsies.  Of note, the patient is planning a trip to [place] on 12/09/23 and is wondering about timing for surgery.  Past Medical History: Past Medical History:  Diagnosis Date   Ankle pain    Hypertension      Past Surgical History: Past Surgical History:  Procedure Laterality Date   ABLATION     uterus   ACHILLES TENDON REPAIR Right 2016   ACHILLES TENDON REPAIR Right 2017   BREAST BIOPSY Left 10/23/2023   Stereo bx, X-clip posterior, path pending   BREAST BIOPSY Left 10/23/2023   Stereo bx, coil clip anterior, path pending   BREAST BIOPSY Left 10/23/2023   MM LT BREAST BX W LOC DEV 1ST LESION IMAGE BX SPEC STEREO GUIDE 10/23/2023 [facility]   BREAST BIOPSY Left 10/23/2023   MM LT BREAST BX W LOC DEV EA AD LESION IMG BX SPEC STEREO GUIDE 10/23/2023 [facility]   COLONOSCOPY WITH PROPOFOL N/A 06/11/2021   Procedure: COLONOSCOPY WITH PROPOFOL;  Surgeon: Pasty Spillers, MD;  Location: [facility]  ENDOSCOPY;  Service: Endoscopy;  Laterality: N/A;    Home Medications: Prior to Admission medications   Medication Sig Start Date End Date Taking? Authorizing Provider  gabapentin (NEURONTIN) 300 MG capsule Take 1 tab 3x daily 06/07/23   Larae Grooms, NP  LORazepam (ATIVAN) 0.5 MG tablet Take 1 tablet (0.5 mg total) by mouth 2 (two) times daily as needed for anxiety. 09/14/23   Larae Grooms, NP  traMADol (ULTRAM) 50 MG tablet TAKE 2 TABLETS BY MOUTH EVERY 6 HOURS AS NEEDED 05/03/23   Larae Grooms, NP  valsartan (DIOVAN) 40 MG tablet Take 1 tablet (40 mg total) by mouth daily. 06/07/23   Larae Grooms, NP    Allergies: No Known Allergies  Social History:  reports that she has never smoked. She has never used smokeless tobacco. She reports current alcohol use. She reports that she does not use drugs.   Family History: Family History  Problem Relation Age of Onset   Diabetes Mother    Thyroid disease Mother    Colon cancer Father    Diabetes Father    Heart disease Father    Diabetes Maternal Grandmother    Breast cancer Neg Hx     Review of Systems: Review of Systems  Constitutional:  Negative for chills and fever.  HENT:  Negative for hearing loss.   Respiratory:  Negative for shortness of breath.   Cardiovascular:  Negative for chest pain.  Gastrointestinal:  Negative  for nausea and vomiting.  Genitourinary:  Negative for dysuria.  Musculoskeletal:  Negative for myalgias.  Skin:        Bruising at biopsy sites  Neurological:  Negative for dizziness.  Psychiatric/Behavioral:  Negative for depression.     Physical Exam BP 132/82   Pulse 77   Temp 99 F (37.2 C) (Oral)   Ht 5\' 7"  (1.702 m)   Wt 239 lb 12.8 oz (108.8 kg)   SpO2 98%   BMI 37.56 kg/m  CONSTITUTIONAL: No acute distress, well nourished. HEENT:  Normocephalic, atraumatic, extraocular motion intact. NECK: Trachea is midline, and there is no jugular venous distension.  RESPIRATORY:   Lungs are clear, and breath sounds are equal bilaterally. Normal respiratory effort without pathologic use of accessory muscles. CARDIOVASCULAR: Heart is regular without murmurs, gallops, or rubs. BREAST:  Left breast with biopsy sites in lower outer quadrant, with surrounding ecchymosis that is improving.  There's associated palpable firmness from the biopsy, but otherwise no palpable masses, other skin changes, or nipple changes.  No left axillary lymphadenopathy.  Right breast without any palpable masses, skin changes, or nipple changes.  No right axillary lymphadenopathy. MUSCULOSKELETAL:  Normal muscle strength and tone in all four extremities.  No peripheral edema or cyanosis. SKIN: Skin turgor is normal. There are no pathologic skin lesions.  NEUROLOGIC:  Motor and sensation is grossly normal.  Cranial nerves are grossly intact. PSYCH:  Alert and oriented to person, place and time. Affect is normal.  Laboratory Analysis: Left breast biopsy on 10/23/23: FINAL DIAGNOSIS  1. 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  2. Breast, left, needle core biopsy, calcifications lower outer quadrant, posterior, x clip :       - DUCTAL CARCINOMA IN SITU (DCIS) INTERMEDIATE GRADE WITH CALCIFICATIONS.   Imaging: Left Mammogram on 10/18/23: FINDINGS: Spot magnification views of the LEFT breast demonstrate a group of amorphous calcifications in a linear distribution in the lower slightly outer breast at anterior to middle depth. The span approximately 16 mm and are best seen on CC view. These are not definitively stable in comparison to prior mammograms. In addition there is a 5 mm group punctate and amorphous calcifications in the lower breast at anterior depth. This is not definitively stable in comparison to prior. The 2 areas span approximately 28 mm.  Spot magnification views demonstrate a group of layering  calcifications in the upper outer breast at anterior to middle depth, consistent with a benign etiology.   IMPRESSION: 1. There are 2 groups of indeterminate calcifications in the LEFT breast, measuring 16 and 5 mm respectively for an estimated span of 28 mm. Recommend stereotactic guided biopsy for definitive characterization.   RECOMMENDATION: LEFT breast stereotactic guided biopsy x2   I have discussed the findings and recommendations with the patient. The biopsy procedure was discussed with the patient and questions were answered. Patient expressed their understanding of the biopsy recommendation. Patient will be scheduled for biopsy at her earliest convenience by the schedulers. Ordering provider will be notified. If applicable, a reminder letter will be sent to the patient regarding the next appointment.   BI-RADS CATEGORY  4: Suspicious.   Assessment and Plan: This is a 48 y.o. female with newly diagnosed left breast DCIS.  --Discussed with the patient the findings on her mammogram and on exam today.  Overall she  has two areas in the lower outer left breast which have been biopsied and showing to be DCIS.  Discussed with her that possible surgical options which would include lumpectomy followed by radiation vs mastectomy +/- radiation, with also the option for immediate reconstruction, and the possibility of bilateral mastectomy and reconstruction.  The patient is currently leaning towards lumpectomy but she will think about it more and inform us. --As for the next steps, discussed with her that given the findings and young age, radiology recommends obtaining a bilateral breast MRI to evaluate for any more extensive disease.  Discussed with her that if the MRI is otherwise without new findings, then we can set her up for SAVI scout tag localization of the two areas so we can do a resection more easily.  Discussed with her the day of  surgery, the possibility for upgrading diagnosis to invasive cancer that would also require sentinel lymph node biopsy as a future surgery. --Tentatively, will schedule the patient for a left breast tag localized lumpectomy.  Reviewed the surgery at length with her including the planned incision, the risks of bleeding, infection, injury to surrounding structures, that this would be an outpatient surgery, use of a breast binder post-operatively, activity restrictions, pain control, and she's willing to proceed. --Will schedule her for surgery on 11/16/23 pending MRI and tag localization.  This would allow for 3 weeks of recovery prior to her trip to make sure there are no complications.  Discussed with her the possibility of having to postpone her trip as well depending on how she's healing or any additional testing that may be needed pending MRI results.  All of her questions have been answered.  She knows that she can call us if she would rather proceed with mastectomy instead or if she has any other questions that we can set up a follow up appointment to discuss further in person.  I spent 40 minutes dedicated to the care of this patient on the date of this encounter to include pre-visit review of records, face-to-face time with the patient discussing diagnosis and management, and any post-visit coordination of care.   Howie Ill, MD [facility]

## 2023-10-30 NOTE — Telephone Encounter (Signed)
 Patient has been advised of Pre-Admission date/time, and Surgery date at [facility].  Surgery Date: 11/16/23 Preadmission Testing Date: Preadmissions to call patient with appointment.    Patient informed of the scheduling process for surgery and information for surgery given.     Patient has been made aware to call [phone], between 1-3:00pm the day before surgery, to find out what time to arrive for surgery.

## 2023-10-30 NOTE — Patient Instructions (Addendum)
 We have scheduled you for a Bilateral Breast MRI. This has been scheduled at [facility] on Thursday 11/02/23 at 9 am . Please arrive there by 8:30am . If you need to reschedule your Scan, you may do so by calling [phone]. Please let us know if you reschedule your scan as we have to get authorization from your insurance for this.     Ductal Carcinoma In Situ  Ductal carcinoma in situ is the presence of abnormal cells in the breast. It is the earliest form of breast cancer. The abnormal cells are located only in the tubes that carry milk to the nipple (milk ducts) and have not spread to other areas. What are the causes? The exact cause of ductal carcinoma in situ is unknown. What increases the risk? The following factors increase the risk of developing ductal carcinoma in situ: Being older than 50 years of age. Being female. Having a family history of breast cancer. Starting menopause after age 84. Starting your menstrual periods before age 44. Having never been pregnant or having your first child after age 54. Having never breastfed. A personal history of: Breast cancer. Dense breast tissue. Radiation treatments to the breasts or chest area. Having the BRCA1 and BRCA2 genes. Having certain types of noncancerous (benign) breast conditions, such as non-proliferative lesions, proliferative lesions with or without atypia (cell abnormalities), or lobular carcinoma in situ. Exposure to the drug DES, which was given to pregnant women from the 1940s to the 1970s. Other risks include: Current or past hormone use, such as: Using birth control. Taking hormone therapy after menopause. Being overweight or obese after menopause. Having an inactive (sedentary) lifestyle. Drinking more than one alcoholic beverage a day. What are the signs or symptoms? Ductal carcinoma in situ does not cause any symptoms. How is this diagnosed? Ductal carcinoma in situ is usually discovered during a  routine X-ray of the breasts to check for abnormal changes (mammogram). To diagnose the condition, your health care provider may do an ultrasound and remove a tissue sample from your breast so it can be examined under a microscope (breast biopsy). Your health care provider may also remove one or more lymph nodes from under your arm to check if the abnormal cells have spread to your lymph nodes (sentinel lymph node biopsy). Lymph nodes are part of the body's disease-fighting system (immune system). They are located throughout the body. The lymph nodes under the arms are usually the first place where abnormal cells spread. How is this treated? Treatment for this condition may include: A lumpectomy. This is surgery to remove the area of abnormal cells, along with a ring of normal tissue. This may also be called breast-conserving surgery. Simple mastectomy. This is surgery to remove breast tissue, the nipple, and the circle of colored tissue around the nipple (areola). Sometimes, one or more lymph nodes from under the arm are also removed and tested for cancer cells. Preventive mastectomy. This is the removal of both breasts. This is usually done only if you have a very high risk of developing breast cancer. Radiation. This is the use of high-energy rays to kill cancer cells. Hormone therapy, which are medicines to keep the abnormal cells from spreading. Follow these instructions at home: Lifestyle Eat a healthy diet. A healthy diet includes lots of fruits and vegetables, low-fat dairy products, lean meats, and fiber. Make sure half your plate is filled with fruits or vegetables. Choose high-fiber foods such as whole-grain breads and cereals. Do not use  any products that contain nicotine or tobacco. These products include cigarettes, chewing tobacco, and vaping devices, such as e-cigarettes. If you need help quitting, ask your health care provider. Alcohol use Do not drink alcohol if: Your health care  provider tells you not to drink. You are pregnant, may be pregnant, or are planning to become pregnant. If you drink alcohol: Limit how much you have to 0-1 drink a day. Know how much alcohol is in your drink. In the [place] one drink equals one 12 oz bottle of beer (355 mL), one 5 oz glass of wine (148 mL), or one 1 oz glass of hard liquor (44 mL). General instructions Take over-the-counter and prescription medicines only as told by your health care provider. Keep all follow-up visits. This is important. Where to find more information American Cancer Society: www.cancer.org National Cancer Institute: www.cancer.gov Contact a health care provider if: You have a fever. You notice a new lump in either breast or under your arm. You have any symptoms or changes that concern you. You notice new fatigue or weakness. Get help right away if: You have chest pain or trouble breathing. These symptoms may be an emergency. Get help right away. Call 911. Do not wait to see if the symptoms will go away. Do not drive yourself to the hospital. Summary Ductal carcinoma in situ is the presence of abnormal cells in the breast. It is the earliest form of breast cancer. The exact cause of ductal carcinoma in situ is unknown. Among other factors, the risk increases with age and with current or past hormone use. To diagnose the condition, your health care provider will remove a tissue sample from your breast so it can be examined under a microscope (breast biopsy). This information is not intended to replace advice given to you by your health care provider. Make sure you discuss any questions you have with your health care provider.   We have spoken today about removing a lump in your breast. This will be done by Dr. Aleen Campi at [facility].  If you are on any injectable weight loss medication, you will need to stop taking your GLP-1 injectable (weight loss) medications 8 days before your surgery to avoid any  complications with anesthesia.   You will most likely be able to leave the hospital several hours after your surgery. Rarely, a patient needs to stay over night but this is a possibility.  Plan to tentatively be off work for 1-2 weeks following the surgery and may return with approximately 2 more weeks of a lifting restriction, no greater than 15 lbs.  Please see your Blue surgery sheet for more information. Our surgery scheduler will call you to look at surgery dates and to go over information.   If you have FMLA or Disability paperwork that needs to be filled out, please have your company fax your paperwork to [phone] or you may drop this by either office. This paperwork will be filled out within 3 days after your surgery has been completed.    Lumpectomy A lumpectomy is a form of "breast conserving" or "breast preservation" surgery. It may also be referred to as a partial mastectomy. During a lumpectomy, the portion of the breast that contains the cancerous tumor or breast mass (the lump) is removed. Some normal tissue around the lump may also be removed to make sure all of the tumor has been removed.  LET [facility] CARE PROVIDER KNOW ABOUT: Any allergies you have. All medicines you are  taking, including vitamins, herbs, eye drops, creams, and over-the-counter medicines. Previous problems you or members of your family have had with the use of anesthetics. Any blood disorders you have. Previous surgeries you have had. Medical conditions you have. RISKS AND COMPLICATIONS Generally, this is a safe procedure. However, problems can occur and include: Bleeding. Infection. Pain. Temporary swelling. Change in the shape of the breast, particularly if a large portion is removed. BEFORE THE PROCEDURE Ask your health care provider about changing or stopping your regular medicines. This is especially important if you are taking diabetes medicines or blood thinners. Do not eat or drink  anything after midnight on the night before the procedure or as directed by your health care provider. Ask your health care provider if you can take a sip of water with any approved medicines. On the day of surgery, your health care provider will use a mammogram or ultrasound to locate and mark the tumor in your breast. These markings on your breast will show where the cut (incision) will be made.   PROCEDURE  An IV tube will be put into one of your veins. You may be given medicine to help you relax before the surgery (sedative). You will be given one of the following: A medicine that numbs the area (local anesthetic). A medicine that makes you fall asleep (general anesthetic). Your health care provider will use a kind of electric scalpel that uses heat to minimize bleeding (electrocautery knife). A curved incision (like a smile or frown) that follows the natural curve of your breast is made, to allow for minimal scarring and better healing. The tumor will be removed with some of the surrounding tissue. This will be sent to the lab for analysis. Your health care provider may also remove your lymph nodes at this time if needed. Sometimes, but not always, a rubber tube called a drain will be surgically inserted into your breast area or armpit to collect excess fluid that may accumulate in the space where the tumor was. This drain is connected to a plastic bulb on the outside of your body. This drain creates suction to help remove the fluid. The incisions will be closed with stitches (sutures). A bandage may be placed over the incisions. AFTER THE PROCEDURE You will be taken to the recovery area. You will be given medicine for pain. A small rubber drain may be placed in the breast for 2-3 days to prevent a collection of blood (hematoma) from developing in the breast. You will be given instructions on caring for the drain before you go home. A pressure bandage (dressing) will be applied for 1-2 days  to prevent bleeding. Ask your health care provider how to care for your bandage at home.   This information is not intended to replace advice given to you by your health care provider. Make sure you discuss any questions you have with your health care provider.   Document Released: 08/22/2006 Document Revised: 08/01/2014 Document Reviewed: 12/14/2012 Elsevier Interactive Patient Education 2016 Elsevier Inc.  Document Revised: 04/15/2021 Document Reviewed: 04/15/2021 Elsevier Patient Education  2024 [employer].

## 2023-10-30 NOTE — Progress Notes (Signed)
 Ms. Leise will see Dr. Smith Robert and Dr. Rushie Chestnut on May 5th.   Left VM with appt. Details.

## 2023-10-30 NOTE — Progress Notes (Signed)
 Ms. Mormino would like to see genetics, appt. Has been set up for 4/9 at 1:00

## 2023-10-31 ENCOUNTER — Encounter: Payer: Self-pay | Admitting: *Deleted

## 2023-10-31 ENCOUNTER — Other Ambulatory Visit: Payer: Self-pay | Admitting: Surgery

## 2023-10-31 DIAGNOSIS — D0512 Intraductal carcinoma in situ of left breast: Secondary | ICD-10-CM

## 2023-10-31 NOTE — Telephone Encounter (Signed)
 Requested medication (s) are due for refill today: yes  Requested medication (s) are on the active medication list: yes  Last refill:  05/03/23 #40  Future visit scheduled: yes  Notes to clinic:  med not delegated to NT to RF   Requested Prescriptions  Pending Prescriptions Disp Refills   traMADol (ULTRAM) 50 MG tablet [Pharmacy Med Name: TRAMADOL HCL 50 MG TABLET] 40 tablet 0    Sig: TAKE 2 TABLETS BY MOUTH EVERY 6 HOURS AS NEEDED     Not Delegated - Analgesics:  Opioid Agonists Failed - 10/31/2023 12:51 PM      Failed - This refill cannot be delegated      Failed - Urine Drug Screen completed in last 360 days      Passed - Valid encounter within last 3 months    Recent Outpatient Visits           1 month ago Throat congestion   [facility] Larae Grooms, NP       Future Appointments             In 1 month Larae Grooms, NP [facility]

## 2023-11-01 ENCOUNTER — Telehealth: Payer: Self-pay | Admitting: Surgery

## 2023-11-01 ENCOUNTER — Inpatient Hospital Stay

## 2023-11-01 ENCOUNTER — Encounter: Payer: Self-pay | Admitting: Licensed Clinical Social Worker

## 2023-11-01 ENCOUNTER — Inpatient Hospital Stay: Admitting: Licensed Clinical Social Worker

## 2023-11-01 ENCOUNTER — Other Ambulatory Visit: Payer: Self-pay | Admitting: Licensed Clinical Social Worker

## 2023-11-01 DIAGNOSIS — D0512 Intraductal carcinoma in situ of left breast: Secondary | ICD-10-CM | POA: Diagnosis not present

## 2023-11-01 DIAGNOSIS — Z800 Family history of malignant neoplasm of digestive organs: Secondary | ICD-10-CM

## 2023-11-01 LAB — GENETIC SCREENING ORDER

## 2023-11-01 NOTE — Progress Notes (Signed)
 REFERRING PROVIDER: Creig Hines, MD [address] [place],  [place] [address]  PRIMARY PROVIDER:  Larae Grooms, NP  PRIMARY REASON FOR VISIT:  1. Ductal carcinoma in situ (DCIS) of left breast   2. Family history of colon cancer      HISTORY OF PRESENT ILLNESS:   Darlene [name], a 48 y.o. female, was seen for a [place] cancer genetics consultation at the request of Dr. Smith Robert due to a personal and family history of cancer.  Darlene [name] presents to clinic today to discuss the possibility of a hereditary predisposition to cancer, genetic testing, and to further clarify her future cancer risks, as well as potential cancer risks for family members.   CANCER HISTORY:  In 2025, at the age of 48, Darlene [name] was diagnosed with DCIS of the left breast. The treatment plan currently includes lumpectomy which is scheduled for 11/16/2023.   RISK FACTORS:  Menarche was at age 29.  Ovaries intact: yes.  Hysterectomy: no.  Menopausal status: premenopausal.  HRT use: 0 years. Colonoscopy: yes;  1 polyp . Mammogram within the last year: yes.  Past Medical History:  Diagnosis Date   Ankle pain    Hypertension     Past Surgical History:  Procedure Laterality Date   ABLATION     uterus   ACHILLES TENDON REPAIR Right 2016   ACHILLES TENDON REPAIR Right 2017   BREAST BIOPSY Left 10/23/2023   Stereo bx, X-clip posterior, path pending   BREAST BIOPSY Left 10/23/2023   Stereo bx, coil clip anterior, path pending   BREAST BIOPSY Left 10/23/2023   MM LT BREAST BX W LOC DEV 1ST LESION IMAGE BX SPEC STEREO GUIDE 10/23/2023 [facility]   BREAST BIOPSY Left 10/23/2023   MM LT BREAST BX W LOC DEV EA AD LESION IMG BX SPEC STEREO GUIDE 10/23/2023 [facility]   COLONOSCOPY WITH PROPOFOL N/A 06/11/2021   Procedure: COLONOSCOPY WITH PROPOFOL;  Surgeon: Pasty Spillers, MD;  Location: [facility];  Service: Endoscopy;  Laterality: N/A;    FAMILY HISTORY:  We obtained a detailed,  4-generation family history.  Significant diagnoses are listed below: Family History  Problem Relation Age of Onset   Diabetes Mother    Thyroid disease Mother    Colon cancer Father 21   Diabetes Father    Heart disease Father    Diabetes Maternal Grandmother    Lung cancer Maternal Grandfather    Breast cancer Neg Hx    Darlene [name] mother is living at 62 and has not had cancer. Maternal grandfather had lung cancer and passed of it at 52.  Darlene [name] father had colon cancer at 38 and is living at 31.   No other known cancers in the family.  Darlene [name] is unaware of previous family history of genetic testing for hereditary cancer risks. There is no reported Ashkenazi Jewish ancestry. There is no known consanguinity.    GENETIC COUNSELING ASSESSMENT: Ms. Busser is a 48 y.o. female with a personal history of DCIS which is somewhat suggestive of a hereditary cancer syndrome and predisposition to cancer. We, therefore, discussed and recommended the following at today's visit.   DISCUSSION: We discussed that approximately 10% of breast cancer is hereditary. Most cases of hereditary breast cancer are associated with BRCA1/BRCA2 genes, although there are other genes associated with hereditary cancer as well. Cancers and risks are gene specific. We discussed that testing is beneficial for several reasons including knowing about cancer risks, identifying potential screening and  risk-reduction options that may be appropriate, and to understand if other family members could be at risk for cancer and allow them to undergo genetic testing.   We reviewed the characteristics, features and inheritance patterns of hereditary cancer syndromes. We also discussed genetic testing, including the appropriate family members to test, the process of testing, insurance coverage and turn-around-time for results. We discussed the implications of a negative, positive and/or variant of uncertain significant  result. We recommended Darlene [name] pursue genetic testing for the [facility] BRCAPlus+CancerNext+RNA gene panel.   Based on Darlene [name] personal and family history of cancer, she meets medical criteria for genetic testing. Despite that she meets criteria, she may still have an out of pocket cost.   PLAN: After considering the risks, benefits, and limitations, Ms. Donathan provided informed consent to pursue genetic testing and the blood sample was sent to [facility] for analysis of the BRCAPlus+CancerNext+RNA panel. Initial results should be available within approximately 5-12 days' time, at which point they will be disclosed by telephone to Darlene [name], as will any additional recommendations warranted by these results. Darlene [name] will receive a summary of her genetic counseling visit and a copy of her results once available. This information will also be available in Epic.   Darlene [name] questions were answered to her satisfaction today. Our contact information was provided should additional questions or concerns arise. Thank you for the referral and allowing Darlene [name] to share in the care of your patient.   Lacy Duverney, MS, [facility] Genetic Counselor [name] Phone: [phone]  55 minutes were spent on the date of the encounter in service to the patient including preparation, face-to-face consultation, documentation and care coordination. Dr. Blake Divine was available for discussion regarding this case.   _______________________________________________________________________ For Office Staff:  Number of people involved in session: 1 Was an Intern/ student involved with case: no

## 2023-11-01 NOTE — Telephone Encounter (Signed)
 Patient called, she is concerned about having breast MRI done on 11/02/23 as she still has some bruising and bumps from the biopsy. Concerned that this may show up as a possible abnormal.  Please advise if ok to still have MRI on 11/02/23 or to move out a little further.  Thanks

## 2023-11-02 ENCOUNTER — Telehealth: Payer: Self-pay

## 2023-11-02 ENCOUNTER — Other Ambulatory Visit: Payer: Self-pay | Admitting: Surgery

## 2023-11-02 ENCOUNTER — Ambulatory Visit
Admission: RE | Admit: 2023-11-02 | Discharge: 2023-11-02 | Disposition: A | Discharge status: Home / Self Care | Source: Ambulatory Visit | Attending: Surgery | Admitting: Surgery

## 2023-11-02 ENCOUNTER — Other Ambulatory Visit: Payer: Self-pay

## 2023-11-02 DIAGNOSIS — N6320 Unspecified lump in the left breast, unspecified quadrant: Secondary | ICD-10-CM

## 2023-11-02 DIAGNOSIS — D0512 Intraductal carcinoma in situ of left breast: Secondary | ICD-10-CM

## 2023-11-02 MED ORDER — GADOBUTROL 1 MMOL/ML IV SOLN
10.0000 mL | Freq: Once | INTRAVENOUS | Status: AC | PRN
  Administered 2023-11-02: 10 mL via INTRAVENOUS

## 2023-11-02 NOTE — Telephone Encounter (Signed)
-----   Message from [facility] sent at 11/02/2023  2:53 PM EDT ----- Regarding: MRI guided biopsy Hi, just saw the MRI results for this patient, and spoke both with radiology and with the patient about it.  Let's get an order for a Left breast MRI guided biopsy for her.  My hope would be that we can expedite this to the next few days, to give [place] enough time to then do the tag localization after and still keep her surgery date for 11/16/23.  If possible, please reach out to MRI so this can be scheduled.    Thanks!  [employer]

## 2023-11-02 NOTE — Telephone Encounter (Signed)
 Message left for the patient letting her know the recommendation for a MRI guided biopsy of the left breast. [facility] will be calling the patient to schedule this biopsy in the next few day. She may call back with any questions.

## 2023-11-03 ENCOUNTER — Other Ambulatory Visit: Payer: Self-pay

## 2023-11-03 ENCOUNTER — Other Ambulatory Visit: Payer: Self-pay | Admitting: Surgery

## 2023-11-03 ENCOUNTER — Ambulatory Visit

## 2023-11-03 ENCOUNTER — Ambulatory Visit
Admission: RE | Admit: 2023-11-03 | Discharge: 2023-11-03 | Disposition: A | Discharge status: Home / Self Care | Source: Ambulatory Visit | Attending: Surgery | Admitting: Surgery

## 2023-11-03 DIAGNOSIS — N6320 Unspecified lump in the left breast, unspecified quadrant: Secondary | ICD-10-CM

## 2023-11-03 DIAGNOSIS — D0512 Intraductal carcinoma in situ of left breast: Secondary | ICD-10-CM

## 2023-11-03 MED ORDER — GADOPICLENOL 0.5 MMOL/ML IV SOLN
10.0000 mL | Freq: Once | INTRAVENOUS | Status: AC | PRN
  Administered 2023-11-03: 10 mL via INTRAVENOUS

## 2023-11-03 NOTE — Addendum Note (Signed)
 Addended by: Sinda Du on: 11/03/2023 12:36 PM   Modules accepted: Orders

## 2023-11-06 ENCOUNTER — Telehealth: Payer: Self-pay

## 2023-11-06 ENCOUNTER — Encounter: Payer: Self-pay | Admitting: *Deleted

## 2023-11-06 NOTE — Telephone Encounter (Signed)
 Spoke with the patient and let her know the recommendations and she will have the biopsy done tomorrow as scheduled.

## 2023-11-06 NOTE — Progress Notes (Signed)
 Darlene [name] called to discuss the stereotactic biopsy scheduled for tomorrow. When she went to have the MRI biopsy the area of concern was not seen, so radiologist recommended stereo biopsy for area of micro calcifications that may correspond to the non mass enhancement seen on 1st MRI.

## 2023-11-06 NOTE — Telephone Encounter (Signed)
 Message let for the patient to call back to discuss MRI and Mammogram findings.   Per Dr Albesa Huguenin on MRI last week when they attempted the biopsy they coukd not see the area of concern from the original MRI done. Instead, they see an area that was not initially mentioned on her mammogram that they feel corresponds to this area of the MRI. So they want to biopsy this area.  But since they couldn't see it on MRI, they want to do stereotactic which is basically mammogram guided. )  The patient is scheduled tomorrow for a Stereo Biopsy of her left breast for the micro calcifications seen on mammogram.

## 2023-11-07 ENCOUNTER — Ambulatory Visit
Admission: RE | Admit: 2023-11-07 | Discharge: 2023-11-07 | Disposition: A | Discharge status: Home / Self Care | Source: Ambulatory Visit | Attending: Surgery | Admitting: Surgery

## 2023-11-07 ENCOUNTER — Ambulatory Visit
Admission: RE | Admit: 2023-11-07 | Discharge: 2023-11-07 | Disposition: A | Discharge status: Home / Self Care | Source: Ambulatory Visit | Attending: Surgery

## 2023-11-07 DIAGNOSIS — N6320 Unspecified lump in the left breast, unspecified quadrant: Secondary | ICD-10-CM | POA: Insufficient documentation

## 2023-11-07 DIAGNOSIS — D0512 Intraductal carcinoma in situ of left breast: Secondary | ICD-10-CM | POA: Insufficient documentation

## 2023-11-07 HISTORY — PX: BREAST BIOPSY: SHX20

## 2023-11-07 MED ORDER — LIDOCAINE-EPINEPHRINE 1 %-1:100000 IJ SOLN
20.0000 mL | Freq: Once | INTRAMUSCULAR | Status: AC
  Administered 2023-11-07: 20 mL
  Filled 2023-11-07: qty 20

## 2023-11-07 MED ORDER — LIDOCAINE 1 % OPTIME INJ - NO CHARGE
5.0000 mL | Freq: Once | INTRAMUSCULAR | Status: AC
  Administered 2023-11-07: 5 mL
  Filled 2023-11-07: qty 6

## 2023-11-08 LAB — SURGICAL PATHOLOGY

## 2023-11-08 NOTE — Progress Notes (Signed)
 11/08/23 Called patient to discuss pathology results.  This posterior biopsy also showed DCIS.  She has two sites anteriorly and one posteriorly, all positive for DCIS.  On MRI, there is enhancement anteriorly that extends towards the nipple.  Based on these findings, discussed with the patient that it may be best to proceed with mastectomy rather than lumpectomy.  There is risk of DCIS extending to the nipple and potential devascularization when trying to obtain clear margins.  Based on different locations, there is also risk of cosmetic deformity.  The patient would still like to have a lumpectomy if at all possible.  Discussed with her that we can send a referral to a breast surgeon for a 2nd opinion and further evaluation.  She's in agreement.  She would like a referral in [place].  Will send referral to Dr. Delane Fear with [facility].  Emmalene Hare, MD

## 2023-11-09 ENCOUNTER — Encounter: Payer: Self-pay | Admitting: *Deleted

## 2023-11-09 ENCOUNTER — Other Ambulatory Visit: Payer: Self-pay

## 2023-11-09 ENCOUNTER — Telehealth: Payer: Self-pay | Admitting: Surgery

## 2023-11-09 DIAGNOSIS — N6320 Unspecified lump in the left breast, unspecified quadrant: Secondary | ICD-10-CM

## 2023-11-09 NOTE — Telephone Encounter (Signed)
 Patient cancelled breast lumpectomy for 11/16/23.  She is going for 2nd opinion.  Will await her decision from that before rescheduling surgery.

## 2023-11-09 NOTE — Progress Notes (Signed)
 Ms. Ryback is getting a 2nd surgical opinion with Dr. Delane Fear so surgery date is unknown at this time.   She sees Dr. Delane Fear on 5/9.  We will r/s her appointments with Dr. Randy Buttery and Dr. Chrystal for after the surgery date once that is known.

## 2023-11-10 ENCOUNTER — Inpatient Hospital Stay: Admission: RE | Admit: 2023-11-10 | Source: Ambulatory Visit

## 2023-11-13 ENCOUNTER — Telehealth: Payer: Self-pay | Admitting: Licensed Clinical Social Worker

## 2023-11-13 ENCOUNTER — Encounter: Payer: Self-pay | Admitting: Licensed Clinical Social Worker

## 2023-11-13 ENCOUNTER — Ambulatory Visit: Payer: Self-pay | Admitting: Licensed Clinical Social Worker

## 2023-11-13 DIAGNOSIS — Z1379 Encounter for other screening for genetic and chromosomal anomalies: Secondary | ICD-10-CM | POA: Insufficient documentation

## 2023-11-13 NOTE — Telephone Encounter (Signed)
 I contacted [name] to discuss her genetic testing results. No pathogenic variants were identified in the 39 genes analyzed. Detailed clinic note to follow.   The test report has been scanned into EPIC and is located under the Molecular Pathology section of the Results Review tab.  A portion of the result report is included below for reference.      Darlene Gee, MS, [facility] Genetic Counselor [email or web address] Phone: [phone]

## 2023-11-13 NOTE — Progress Notes (Signed)
 HPI:   Darlene [name] was previously seen in the [place] Cancer Genetics clinic due to a personal and family history of cancer and concerns regarding a hereditary predisposition to cancer. Please refer to our prior cancer genetics clinic note for more information regarding our discussion, assessment and recommendations, at the time. Darlene [name] recent genetic test results were disclosed to her, as were recommendations warranted by these results. These results and recommendations are discussed in more detail below.  CANCER HISTORY:  Oncology History   No history exists.    FAMILY HISTORY:  We obtained a detailed, 4-generation family history.  Significant diagnoses are listed below: Family History  Problem Relation Age of Onset   Diabetes Mother    Thyroid disease Mother    Colon cancer Father 95   Diabetes Father    Heart disease Father    Diabetes Maternal Grandmother    Lung cancer Maternal Grandfather    Breast cancer Neg Hx     Darlene [name] mother is living at 51 and has not had cancer. Maternal grandfather had lung cancer and passed of it at 14.   Darlene [name] father had colon cancer at 46 and is living at 31.    No other known cancers in the family.   Darlene [name] is unaware of previous family history of genetic testing for hereditary cancer risks. There is no reported Ashkenazi Jewish ancestry. There is no known consanguinity.     GENETIC TEST RESULTS:  The [facility] BRCAPlus+CancerNext+RNA Panel found no pathogenic mutations.   The [facility] CancerNext+RNAinsight Panel includes sequencing, rearrangement analysis, and RNA analysis for the following 39 genes: APC, ATM, BAP1, BARD1, BMPR1A, BRCA1, BRCA2, BRIP1, CDH1, CDKN2A, CHEK2, FH, FLCN, MET, MLH1, MSH2, MSH6, MUTYH, NF1, NTHL1, PALB2, PMS2, PTEN, RAD51C, RAD51D, SMAD4, STK11, TP53, TSC1, TSC2, and VHL (sequencing and deletion/duplication); AXIN2, HOXB13, MBD4, MSH3, POLD1 and POLE (sequencing only); EPCAM and GREM1  (deletion/duplication only).  The test report has been scanned into EPIC and is located under the Molecular Pathology section of the Results Review tab.  A portion of the result report is included below for reference. Genetic testing reported out on 11/11/2023.      Genetic testing identified a variant of uncertain significance (VUS) in the POLE gene.  At this time, it is unknown if this variant is associated with an increased risk for cancer or if it is benign, but most uncertain variants are reclassified to benign. It should not be used to make medical management decisions. With time, we suspect the laboratory will determine the significance of this variant, if any. If the laboratory reclassifies this variant, we will attempt to contact Darlene [name] to discuss it further.   Even though a pathogenic variant was not identified, possible explanations for the cancer in the family may include: There may be no hereditary risk for cancer in the family. The cancers in Darlene [name] and/or her family may be sporadic/familial or due to other genetic and environmental factors. There may be a gene mutation in one of these genes that current testing methods cannot detect but that chance is small. There could be another gene that has not yet been discovered, or that we have not yet tested, that is responsible for the cancer diagnoses in the family.  It is also possible there is a hereditary cause for the cancer in the family that Darlene [name] did not inherit.  Therefore, it is important to remain in touch with cancer genetics in the future so that  we can continue to offer [name] the most up to date genetic testing.   ADDITIONAL GENETIC TESTING:  We discussed with Darlene [name] that her genetic testing was fairly extensive.  If there are additional relevant genes identified to increase cancer risk that can be analyzed in the future, we would be happy to discuss and coordinate this testing at that time.     CANCER SCREENING RECOMMENDATIONS:  Darlene [name] test result is considered negative (normal).  This means that we have not identified a hereditary cause for her personal and family history of cancer at this time.   An individual's cancer risk and medical management are not determined by genetic test results alone. Overall cancer risk assessment incorporates additional factors, including personal medical history, family history, and any available genetic information that may result in a personalized plan for cancer prevention and surveillance. Therefore, it is recommended she continue to follow the cancer management and screening guidelines provided by her oncology and primary healthcare provider.  Based on the reported personal and family history, specific cancer screenings for Darlene [name] and her family include: \ Colon Cancer Screening: Due to Darlene [name] family's history of colon cancer, she is recommended to repeat colonoscopies at least every 5 years starting at 60 (if first degree relative) or 10 years before earliest colorectal cancer diagnosis. More frequent colonoscopies may be recommended if polyps are identified. \  RECOMMENDATIONS FOR FAMILY MEMBERS:   Individuals in this family might be at some increased risk of developing cancer, over the general population risk, due to the family history of cancer.  Individuals in the family should notify their providers of the family history of cancer. We recommend women in this family have a yearly mammogram beginning at age 56, or 72 years younger than the earliest onset of cancer, an annual clinical breast exam, and perform monthly breast self-exams.  Family members should have colonoscopies by at age 52, or earlier, as recommended by their providers. We do not recommend familial testing for the POLE variant of uncertain significance (VUS).  FOLLOW-UP:  Lastly, we discussed with Darlene [name] that cancer genetics is a rapidly  advancing field and it is possible that new genetic tests will be appropriate for her and/or her family members in the future. We encouraged her to remain in contact with cancer genetics on an annual basis so we can update her personal and family histories and let her know of advances in cancer genetics that may benefit this family.   Our contact number was provided. Ms. Waterbury questions were answered to her satisfaction, and she knows she is welcome to call us  at anytime with additional questions or concerns.    Valri Gee, MS, [facility] Genetic Counselor [email or web address] Phone: [phone]

## 2023-11-16 ENCOUNTER — Ambulatory Visit: Admission: RE | Admit: 2023-11-16 | Source: Home / Self Care | Admitting: Surgery

## 2023-11-16 ENCOUNTER — Encounter: Admission: RE | Payer: Self-pay | Source: Home / Self Care

## 2023-11-16 SURGERY — BREAST LUMPECTOMY WITH RADIO FREQUENCY LOCALIZER
Anesthesia: General | Site: Breast | Laterality: Left

## 2023-11-27 ENCOUNTER — Institutional Professional Consult (permissible substitution): Admitting: Radiation Oncology

## 2023-11-27 ENCOUNTER — Ambulatory Visit: Admitting: Oncology

## 2023-11-29 ENCOUNTER — Inpatient Hospital Stay: Attending: Oncology | Admitting: Hospice and Palliative Medicine

## 2023-11-29 ENCOUNTER — Ambulatory Visit: Admitting: Occupational Therapy

## 2023-11-29 DIAGNOSIS — D0512 Intraductal carcinoma in situ of left breast: Secondary | ICD-10-CM

## 2023-11-29 NOTE — Progress Notes (Signed)
 Multidisciplinary Oncology Council Documentation  Darlene [name] was presented by our [facility] on 11/29/2023, which included representatives from:  Palliative Care Dietitian  Physical/Occupational Therapist Nurse Navigator Genetics Social work Survivorship RN Financial Navigator Research RN   Darlene [name] currently presents with history of DCIS  We reviewed previous medical and familial history, history of present illness, and recent lab results along with all available histopathologic and imaging studies. The MOC considered available treatment options and made the following recommendations/referrals:  Rehab screening, SW, genetics  The MOC is a meeting of clinicians from various specialty areas who evaluate and discuss patients for whom a multidisciplinary approach is being considered. Final determinations in the plan of care are those of the provider(s).   Today's extended care, comprehensive team conference, Darlene [name] was not present for the discussion and was not examined.

## 2023-12-05 ENCOUNTER — Inpatient Hospital Stay

## 2023-12-05 ENCOUNTER — Other Ambulatory Visit: Payer: Self-pay | Admitting: General Surgery

## 2023-12-05 DIAGNOSIS — D0512 Intraductal carcinoma in situ of left breast: Secondary | ICD-10-CM

## 2023-12-05 NOTE — Progress Notes (Signed)
 [facility] Clinical Social Work  Initial Assessment   Darlene [name] is a 48 y.o. year old female contacted by phone. Clinical Social Work was referred by [facility] for assessment of psychosocial needs.   SDOH (Social Determinants of Health) assessments performed: Yes SDOH Interventions    Flowsheet Row Office Visit from 01/21/2022 in [facility] Office Visit from 12/23/2021 in [facility] Office Visit from 12/21/2021 in [facility]  SDOH Interventions     Depression Interventions/Treatment  Medication, Currently on Treatment Medication, Currently on Treatment Medication, Currently on Treatment       SDOH Screenings   Food Insecurity: No Food Insecurity (10/27/2023)  Housing: Unknown (10/29/2023)   [facility]  Transportation Needs: No Transportation Needs (10/27/2023)  Utilities: Not At Risk (10/27/2023)  Alcohol Screen: Low Risk  (09/13/2023)  Depression (PHQ2-9): Low Risk  (10/27/2023)  Financial Resource Strain: Low Risk  (09/13/2023)  Physical Activity: Sufficiently Active (09/13/2023)  Social Connections: Unknown (09/13/2023)  Stress: Stress Concern Present (09/13/2023)  Tobacco Use: Low Risk  (12/01/2023)   Received from [facility]     Distress Screen completed: No    10/27/2023    2:22 PM  ONCBCN DISTRESS SCREENING  Screening Type Initial Screening  How much distress have you been experiencing in the past week? (0-10) 0      Family/Social Information:  Housing Arrangement: patient lives with her husband. Family members/support persons in your life? Family Transportation concerns: no  Employment: Working full time from home. Income source: Employment Financial concerns: No Type of concern: None Food access concerns: no Religious or spiritual practice: No Advanced directives: No Services Currently in place:  BCBS  Coping/ Adjustment to diagnosis: Patient  understands treatment plan and what happens next? yes Concerns about diagnosis and/or treatment: Afraid of cancer Patient reported stressors: Adjusting to my illness Hopes and/or priorities: To have the best outcome. Patient enjoys time with family/ friends Current coping skills/ strengths: Ability for insight , Average or above average intelligence , Capable of independent living , Communication skills , Contractor , General fund of knowledge , Motivation for treatment/growth , and Supportive family/friends     SUMMARY: Current SDOH Barriers:  None per patient.  Clinical Social Work Clinical Goal(s):  No clinical social work goals at this time  Interventions: Discussed common feeling and emotions when being diagnosed with cancer, and the importance of support during treatment Informed patient of the support team roles and support services at [facility] Provided CSW contact information and encouraged patient to call with any questions or concerns Provided patient with information about CSW role and the [employer].  Mailed brochure and business card.  Informed RN Navigator that patient stated she is to have a lumpectomy then radiation at [facility].   Follow Up Plan: Patient will contact CSW with any support or resource needs Patient verbalizes understanding of plan: Yes    Kennth Peal, LCSW Clinical Social Worker [facility]

## 2023-12-06 ENCOUNTER — Other Ambulatory Visit: Payer: Self-pay | Admitting: General Surgery

## 2023-12-06 DIAGNOSIS — D0512 Intraductal carcinoma in situ of left breast: Secondary | ICD-10-CM

## 2023-12-07 ENCOUNTER — Encounter: Payer: Self-pay | Admitting: *Deleted

## 2023-12-07 NOTE — Progress Notes (Signed)
 Lumpectomy is scheduled for 6/19.  She will see Dr. Randy Buttery and Dr. Jacalyn Martin on 7/2.  Appt. Details given to her.

## 2023-12-25 ENCOUNTER — Ambulatory Visit (INDEPENDENT_AMBULATORY_CARE_PROVIDER_SITE_OTHER): Payer: Self-pay | Admitting: Nurse Practitioner

## 2023-12-25 ENCOUNTER — Encounter: Payer: Self-pay | Admitting: Nurse Practitioner

## 2023-12-25 VITALS — BP 139/85 | HR 78 | Ht 67.0 in | Wt 236.0 lb

## 2023-12-25 DIAGNOSIS — Z0000 Encounter for general adult medical examination without abnormal findings: Secondary | ICD-10-CM

## 2023-12-25 DIAGNOSIS — E785 Hyperlipidemia, unspecified: Secondary | ICD-10-CM | POA: Diagnosis not present

## 2023-12-25 DIAGNOSIS — E669 Obesity, unspecified: Secondary | ICD-10-CM | POA: Diagnosis not present

## 2023-12-25 DIAGNOSIS — F419 Anxiety disorder, unspecified: Secondary | ICD-10-CM

## 2023-12-25 DIAGNOSIS — I10 Essential (primary) hypertension: Secondary | ICD-10-CM | POA: Diagnosis not present

## 2023-12-25 MED ORDER — GABAPENTIN 300 MG PO CAPS: ORAL_CAPSULE | ORAL | 1 refills | Status: DC

## 2023-12-25 MED ORDER — VALSARTAN 40 MG PO TABS: 40.0000 mg | ORAL_TABLET | Freq: Every day | ORAL | 1 refills | Status: DC

## 2023-12-25 NOTE — Assessment & Plan Note (Signed)
 Recommended eating smaller high protein, low fat meals more frequently and exercising 30 mins a day 5 times a week with a goal of 10-15lb weight loss in the next 3 months.

## 2023-12-25 NOTE — Assessment & Plan Note (Signed)
 Chronic.  Controlled.  Continue with current medication regimen of PRN Lorazepam .  Labs ordered today.  Return to clinic in 6 months for reevaluation.  Call sooner if concerns arise.

## 2023-12-25 NOTE — Assessment & Plan Note (Signed)
Chronic.  Controlled.  Continue with current medication regimen of Valsartan 40mg .  Refills sent today.  Labs ordered today.  Return to clinic in 6 months for reevaluation.  Call sooner if concerns arise.

## 2023-12-25 NOTE — Progress Notes (Signed)
 BP 139/85   Pulse 78   Ht 5\' 7"  (1.702 m)   Wt 236 lb (107 kg)   BMI 36.96 kg/m    Subjective:    Patient ID: Darlene [name], female    DOB: [birth year], 48 y.o.   MRN: [id number]  HPI: Darlene [name] is a 48 y.o. female presenting on 12/25/2023 for comprehensive medical examination. Current medical complaints include: none  She currently lives with: Menopausal Symptoms: no  HYPERTENSION Hypertension status: controlled Satisfied with current treatment? no Duration of hypertension: years BP monitoring frequency:  not checking BP range:  BP medication side effects:  no Medication compliance: poor compliance Previous BP meds: Valsartan   Aspirin: no Recurrent headaches: no Visual changes: no Palpitations: no Dyspnea: no Chest pain: no Lower extremity edema: no Dizzy/lightheaded: no  Recently diagnosed with breast cancer.  Will have a Lumpectomy on June 19.    ANXIETY Patient states she has been okay but not doing that great.  She did travel to [place] and didn't have a lot of panic attacks.  She is worried about the cancer.  Denies SI.   Depression Screen done today and results listed below:     12/25/2023    8:28 AM 10/27/2023    2:30 PM 06/07/2023    8:55 AM 12/07/2022    9:08 AM 06/07/2022    3:50 PM  Depression screen PHQ 2/9  Decreased Interest 0 0 0 0 0  Down, Depressed, Hopeless 0 0 1 0 0  PHQ - 2 Score 0 0 1 0 0  Altered sleeping 0  1 1 0  Tired, decreased energy 0  1 0 0  Change in appetite 0  0 0 0  Feeling bad or failure about yourself  0  0 0 0  Trouble concentrating 0  0 0 0  Moving slowly or fidgety/restless 0  0 0 0  Suicidal thoughts 0  0 0 0  PHQ-9 Score 0  3 1 0  Difficult doing work/chores Not difficult at all    Not difficult at all    The patient does not have a history of falls. I did complete a risk assessment for falls. A plan of care for falls was documented.   Past Medical History:  Past Medical History:  Diagnosis Date    Ankle pain    Hypertension     Surgical History:  Past Surgical History:  Procedure Laterality Date   ABLATION     uterus   ACHILLES TENDON REPAIR Right 2016   ACHILLES TENDON REPAIR Right 2017   BREAST BIOPSY Left 10/23/2023   Stereo bx, X-clip posterior, path pending   BREAST BIOPSY Left 10/23/2023   Stereo bx, coil clip anterior, path pending   BREAST BIOPSY Left 10/23/2023   MM LT BREAST BX W LOC DEV 1ST LESION IMAGE BX SPEC STEREO GUIDE 10/23/2023 [facility]   BREAST BIOPSY Left 10/23/2023   MM LT BREAST BX W LOC DEV EA AD LESION IMG BX SPEC STEREO GUIDE 10/23/2023 [facility]   BREAST BIOPSY Left 11/07/2023   MM LT BREAST BX W LOC DEV 1ST LESION IMAGE BX SPEC STEREO GUIDE 11/07/2023 [facility]   COLONOSCOPY WITH PROPOFOL  N/A 06/11/2021   Procedure: COLONOSCOPY WITH PROPOFOL ;  Surgeon: Irby Mannan, MD;  Location: [facility];  Service: Endoscopy;  Laterality: N/A;    Medications:  Current Outpatient Medications on File Prior to Visit  Medication Sig   fluticasone (FLONASE) 50 MCG/ACT nasal spray Place 2 sprays into both nostrils  daily as needed for allergies or rhinitis.   LORazepam  (ATIVAN ) 0.5 MG tablet Take 1 tablet (0.5 mg total) by mouth 2 (two) times daily as needed for anxiety.   Pseudoephedrine-APAP-DM (DAYQUIL PO) Take by mouth.   traMADol  (ULTRAM ) 50 MG tablet TAKE 2 TABLETS BY MOUTH EVERY 6 HOURS AS NEEDED   No current facility-administered medications on file prior to visit.    Allergies:  No Known Allergies  Social History:  Social History   Socioeconomic History   Marital status: Married    Spouse name: Not on file   Number of children: Not on file   Years of education: Not on file   Highest education level: Bachelor's degree (e.g., BA, AB, BS)  Occupational History   Not on file  Tobacco Use   Smoking status: Never   Smokeless tobacco: Never  Vaping Use   Vaping status: Never Used  Substance and Sexual Activity    Alcohol use: Yes    Comment: occ   Drug use: Never   Sexual activity: Yes    Partners: Male    Birth control/protection: Implant  Other Topics Concern   Not on file  Social History Narrative   Patient lives with husband. Recently relocated to [facility] from [place], and prior to that lived in [place] . Patient is from [place] and her family continues to reside there.    Social Drivers of Corporate investment banker Strain: Low Risk  (09/13/2023)   Overall Financial Resource Strain (CARDIA)    Difficulty of Paying Living Expenses: Not hard at all  Food Insecurity: No Food Insecurity (10/27/2023)   Hunger Vital Sign    Worried About Running Out of Food in the Last Year: Never true    Ran Out of Food in the Last Year: Never true  Transportation Needs: No Transportation Needs (10/27/2023)   PRAPARE - Administrator, Civil Service (Medical): No    Lack of Transportation (Non-Medical): No  Physical Activity: Sufficiently Active (09/13/2023)   Exercise Vital Sign    Days of Exercise per Week: 3 days    Minutes of Exercise per Session: 60 min  Stress: Stress Concern Present (09/13/2023)   [employer] of Occupational Health - Occupational Stress Questionnaire    Feeling of Stress : To some extent  Social Connections: Unknown (09/13/2023)   Social Connection and Isolation Panel [NHANES]    Frequency of Communication with Friends and Family: Twice a week    Frequency of Social Gatherings with Friends and Family: Patient declined    Attends Religious Services: Never    Database administrator or Organizations: Yes    Attends Engineer, structural: More than 4 times per year    Marital Status: Married  Catering manager Violence: Not At Risk (10/27/2023)   Humiliation, Afraid, Rape, and Kick questionnaire    Fear of Current or Ex-Partner: No    Emotionally Abused: No    Physically Abused: No    Sexually Abused: No   Social History   Tobacco Use  Smoking Status Never  Smokeless  Tobacco Never   Social History   Substance and Sexual Activity  Alcohol Use Yes   Comment: occ    Family History:  Family History  Problem Relation Age of Onset   Diabetes Mother    Thyroid disease Mother    Colon cancer Father 42   Diabetes Father    Heart disease Father    Diabetes Maternal Grandmother    Lung  cancer Maternal Grandfather    Breast cancer Neg Hx     Past medical history, surgical history, medications, allergies, family history and social history reviewed with patient today and changes made to appropriate areas of the chart.   Review of Systems  Eyes:  Negative for blurred vision and double vision.  Respiratory:  Negative for shortness of breath.   Cardiovascular:  Negative for chest pain, palpitations and leg swelling.  Neurological:  Negative for dizziness and headaches.   All other ROS negative except what is listed above and in the HPI.      Objective:    BP 139/85   Pulse 78   Ht 5\' 7"  (1.702 m)   Wt 236 lb (107 kg)   BMI 36.96 kg/m   Wt Readings from Last 3 Encounters:  12/25/23 236 lb (107 kg)  10/30/23 239 lb 12.8 oz (108.8 kg)  10/27/23 239 lb 8 oz (108.6 kg)    Physical Exam Vitals and nursing note reviewed.  Constitutional:      General: She is awake. She is not in acute distress.    Appearance: Normal appearance. She is well-developed. She is obese. She is not ill-appearing.  HENT:     Head: Normocephalic and atraumatic.     Right Ear: Hearing, tympanic membrane, ear canal and external ear normal. No drainage.     Left Ear: Hearing, tympanic membrane, ear canal and external ear normal. No drainage.     Nose: Nose normal.     Right Sinus: No maxillary sinus tenderness or frontal sinus tenderness.     Left Sinus: No maxillary sinus tenderness or frontal sinus tenderness.     Mouth/Throat:     Mouth: Mucous membranes are moist.     Pharynx: Oropharynx is clear. Uvula midline. No pharyngeal swelling, oropharyngeal exudate or  posterior oropharyngeal erythema.  Eyes:     General: Lids are normal.        Right eye: No discharge.        Left eye: No discharge.     Extraocular Movements: Extraocular movements intact.     Conjunctiva/sclera: Conjunctivae normal.     Pupils: Pupils are equal, round, and reactive to light.     Visual Fields: Right eye visual fields normal and left eye visual fields normal.  Neck:     Thyroid: No thyromegaly.     Vascular: No carotid bruit.     Trachea: Trachea normal.  Cardiovascular:     Rate and Rhythm: Normal rate and regular rhythm.     Heart sounds: Normal heart sounds. No murmur heard.    No gallop.  Pulmonary:     Effort: Pulmonary effort is normal. No accessory muscle usage or respiratory distress.     Breath sounds: Normal breath sounds.  Chest:  Breasts:    Right: Normal.     Left: Normal.  Abdominal:     General: Bowel sounds are normal.     Palpations: Abdomen is soft. There is no hepatomegaly or splenomegaly.     Tenderness: There is no abdominal tenderness.  Musculoskeletal:        General: Normal range of motion.     Cervical back: Normal range of motion and neck supple.     Right lower leg: No edema.     Left lower leg: No edema.  Lymphadenopathy:     Head:     Right side of head: No submental, submandibular, tonsillar, preauricular or posterior auricular adenopathy.  Left side of head: No submental, submandibular, tonsillar, preauricular or posterior auricular adenopathy.     Cervical: No cervical adenopathy.     Upper Body:     Right upper body: No supraclavicular, axillary or pectoral adenopathy.     Left upper body: No supraclavicular, axillary or pectoral adenopathy.  Skin:    General: Skin is warm and dry.     Capillary Refill: Capillary refill takes less than 2 seconds.     Findings: No rash.  Neurological:     Mental Status: She is alert and oriented to person, place, and time.     Gait: Gait is intact.     Deep Tendon Reflexes: Reflexes  are normal and symmetric.     Reflex Scores:      Brachioradialis reflexes are 2+ on the right side and 2+ on the left side.      Patellar reflexes are 2+ on the right side and 2+ on the left side. Psychiatric:        Attention and Perception: Attention normal.        Mood and Affect: Mood normal.        Speech: Speech normal.        Behavior: Behavior normal. Behavior is cooperative.        Thought Content: Thought content normal.        Judgment: Judgment normal.     Results for orders placed or performed during the hospital encounter of 11/07/23  Surgical pathology   Collection Time: 11/07/23 12:00 AM  Result Value Ref Range   SURGICAL PATHOLOGY      SURGICAL PATHOLOGY [facility] [address] [place], [place] [address] Telephone [phone] or [phone] Fax [phone]  REPORT OF SURGICAL PATHOLOGY   Accession #: [id number] Patient Name: GARIMA, CHRONIS Visit # : [id number]  MRN: [id number] Physician: Clancy Crimes DOB/Age [birth year] (Age: 72) Gender: F Collected Date: 11/07/2023 Received Date: 11/07/2023  FINAL DIAGNOSIS       1. Breast, left, needle core biopsy, Outer posterior depth calcifications, (ribbon clip) :       - DUCTAL CARCINOMA IN SITU, INTERMEDIATE GRADE      - NECROSIS: NOT IDENTIFIED      - CALCIFICATIONS: NOT IDENTIFIED      - DCIS LENGTH: 0.4 CM       Diagnosis Note : Dr. Almeda Jacobs reviewed the case and concurs with the diagnosis.      Immunohistochemical stain for ER is pending and will be reported in an addendum.      Ms. Ladonna Pickup was notified on 11/08/2023.      DATE SIGNED OUT: 11/08/2023 ELECTRONIC SIGNATURE : Bernetta Brilliant Md, Nilesh,  Pathologist, Electronic Signature  MICROSCOPIC DESCRIPTION  CASE COMMENTS STAINS USED IN DIAGNOSIS: H&E-2 H&E-3 H&E-4 H&E H&E-2 H&E-3 H&E-4 H&E *RECUT 1 SLIDE *RECUT DEEPER X 6 LEVELS *RECUT DEEPER X 6 LEVELS *RECUT DEEPER X 6 LEVELS *RECUT DEEPER  X 6 LEVELS *RECUT DEEPER X 6 LEVELS *RECUT DEEPER X 6 LEVELS *RECUT DEEPER X 6 LEVELS *RECUT DEEPER X 6 LEVELS *RECUT DEEPER X 6 LEVELS Stains used in diagnosis 1 ER-ACIS Estrogen receptor (6F11), immunohistochemical stains are performed on formalin fixed, paraffin embedded tissue using a 3,3"-diaminobenzidine (DAB) chromogen and Leica Bond Autostainer System.  The staining intensity of the nucleus is scored manually and is reported as the percentage of tumor cell nuclei demonstrating specific nuclear staining.Specimens are fixed in 10% Neutral Buffered Formalin for at least 6 hours and  up to 72 hours.  These tests have not be validated on decalcified tissue.  Results should be interpreted with caution given t he possibility of false negative results on decalcified specimens.  ADDENDUM Breast, left, needle core biopsy, Outer posterior depth calcifications. (ribbon clip) PROGNOSTIC INDICATORS  Results: IMMUNOHISTOCHEMICAL AND MORPHOMETRIC ANALYSIS PERFORMED MANUALLY Estrogen Receptor:  90%, POSITIVE, MODERATE-STRONG STAINING INTENSITY REFERENCE RANGE ESTROGEN RECEPTOR NEGATIVE     0% POSITIVE       =>1% All controls stained appropriately Earleen Glazier, John, Pathologist, Electronic Signature ( Signed [phone]) Additional deeper levels reveal dystrophic calcifications. Verlyn Goad, [place], Sports administrator, Electronic Signature [phone])   CLINICAL HISTORY  SPECIMEN(S) OBTAINED 1. Breast, left, needle core biopsy, Outer Posterior Depth Calcifications, (ribbon Clip)  SPECIMEN COMMENTS: SPECIMEN CLINICAL INFORMATION: 1. Hx of ipsilateral DCIS, vague MR enhancement at an area with incidental calcs in the outer breast at posterior depth greater than 8 cm for site  biopsy proven DCIS    Gross Description 1. Received in formalin in a carousel container, labeled with the patient's name and left breast stereo biopsy calcs outer breast posterior depth, and consists of a  2.4 x 2.2 x 0.3 cm aggregate of tan yellow fibroadipose tissue.  A majority of the specimen is designated as containing microcalcifications on the specimen lid.  The specimen is entirely submitted in two cassettes.      TIF 11:46 a.m. on 11/07/23.  CIT 3 mins  (KL:gt, 11/08/23)        Report signed out from the following location(s) [place] [address], [place] [address] CLIA #: [id number]  [facility] [address] [place], [place] [address] CLIA #: [id number]       Assessment & Plan:   Problem List Items Addressed This Visit       Cardiovascular and Mediastinum   Primary hypertension   Chronic.  Controlled.  Continue with current medication regimen of Valsartan  40mg .   Refills sent today.  Labs ordered today.  Return to clinic in 6 months for reevaluation.  Call sooner if concerns arise.       Relevant Medications   valsartan  (DIOVAN ) 40 MG tablet   Other Relevant Orders   Comprehensive metabolic panel with GFR     Other   Obesity (BMI 35.0-39.9 without comorbidity)   Recommended eating smaller high protein, low fat meals more frequently and exercising 30 mins a day 5 times a week with a goal of 10-15lb weight loss in the next 3 months.        Anxiety   Chronic.  Controlled.  Continue with current medication regimen of PRN Lorazepam .  Labs ordered today.  Return to clinic in 6 months for reevaluation.  Call sooner if concerns arise.        Hyperlipidemia   Labs ordered at visit today.  Will make recommendations based on lab results.       Relevant Medications   valsartan  (DIOVAN ) 40 MG tablet   Other Relevant Orders   Lipid panel   Other Visit Diagnoses       Annual physical exam    -  Primary   Health maintenance reviewed.  Labs ordered.  Vaccines reviewed.  PAP up to date.  Mammogram up to date. Colonoscopy will be done at a later date.   Relevant Orders   CBC with Differential/Platelet   Comprehensive  metabolic panel with GFR   Lipid panel  TSH        Follow up plan: Return in about 6 months (around 06/25/2024) for Physical and Fasting labs.   LABORATORY TESTING:  - Pap smear: up to date  IMMUNIZATIONS:   - Tdap: Tetanus vaccination status reviewed: last tetanus booster within 10 years. - Influenza: Up to date - Pneumovax: Not applicable - Prevnar: Not applicable - COVID: Up to date - HPV: Not applicable - Shingrix vaccine: Not applicable  SCREENING: -Mammogram: Up to date  - Colonoscopy: Up to date - needs updated but is about to have breast cancer surgery and treatment - Bone Density: Not applicable  -Hearing Test: Not applicable  -Spirometry: Not applicable   PATIENT COUNSELING:   Advised to take 1 mg of folate supplement per day if capable of pregnancy.   Sexuality: Discussed sexually transmitted diseases, partner selection, use of condoms, avoidance of unintended pregnancy  and contraceptive alternatives.   Advised to avoid cigarette smoking.  I discussed with the patient that most people either abstain from alcohol or drink within safe limits (<=14/week and <=4 drinks/occasion for males, <=7/weeks and <= 3 drinks/occasion for females) and that the risk for alcohol disorders and other health effects rises proportionally with the number of drinks per week and how often a drinker exceeds daily limits.  Discussed cessation/primary prevention of drug use and availability of treatment for abuse.   Diet: Encouraged to adjust caloric intake to maintain  or achieve ideal body weight, to reduce intake of dietary saturated fat and total fat, to limit sodium intake by avoiding high sodium foods and not adding table salt, and to maintain adequate dietary potassium and calcium preferably from fresh fruits, vegetables, and low-fat dairy products.    stressed the importance of regular exercise  Injury prevention: Discussed safety belts, safety helmets, smoke detector, smoking  near bedding or upholstery.   Dental health: Discussed importance of regular tooth brushing, flossing, and dental visits.    NEXT PREVENTATIVE PHYSICAL DUE IN 1 YEAR. Return in about 6 months (around 06/25/2024) for Physical and Fasting labs.

## 2023-12-25 NOTE — Assessment & Plan Note (Signed)
 Labs ordered at visit today.  Will make recommendations based on lab results.

## 2023-12-26 ENCOUNTER — Ambulatory Visit: Payer: Self-pay | Admitting: Nurse Practitioner

## 2023-12-26 LAB — CBC WITH DIFFERENTIAL/PLATELET
Basophils Absolute: 0.1 10*3/uL (ref 0.0–0.2)
Basos: 1 %
EOS (ABSOLUTE): 0.3 10*3/uL (ref 0.0–0.4)
Eos: 5 %
Hematocrit: 43.6 % (ref 34.0–46.6)
Hemoglobin: 14 g/dL (ref 11.1–15.9)
Immature Grans (Abs): 0 10*3/uL (ref 0.0–0.1)
Immature Granulocytes: 0 %
Lymphocytes Absolute: 1.7 10*3/uL (ref 0.7–3.1)
Lymphs: 24 %
MCH: 29.5 pg (ref 26.6–33.0)
MCHC: 32.1 g/dL (ref 31.5–35.7)
MCV: 92 fL (ref 79–97)
Monocytes Absolute: 0.6 10*3/uL (ref 0.1–0.9)
Monocytes: 9 %
Neutrophils Absolute: 4.4 10*3/uL (ref 1.4–7.0)
Neutrophils: 61 %
Platelets: 292 10*3/uL (ref 150–450)
RBC: 4.75 x10E6/uL (ref 3.77–5.28)
RDW: 11.8 % (ref 11.7–15.4)
WBC: 7.2 10*3/uL (ref 3.4–10.8)

## 2023-12-26 LAB — COMPREHENSIVE METABOLIC PANEL WITH GFR
ALT: 23 IU/L (ref 0–32)
AST: 22 IU/L (ref 0–40)
Albumin: 4.1 g/dL (ref 3.9–4.9)
Alkaline Phosphatase: 22 IU/L — ABNORMAL LOW (ref 44–121)
BUN/Creatinine Ratio: 14 (ref 9–23)
BUN: 11 mg/dL (ref 6–24)
Bilirubin Total: 0.5 mg/dL (ref 0.0–1.2)
CO2: 24 mmol/L (ref 20–29)
Calcium: 9.2 mg/dL (ref 8.7–10.2)
Chloride: 99 mmol/L (ref 96–106)
Creatinine, Ser: 0.79 mg/dL (ref 0.57–1.00)
Globulin, Total: 3.1 g/dL (ref 1.5–4.5)
Glucose: 90 mg/dL (ref 70–99)
Potassium: 4.1 mmol/L (ref 3.5–5.2)
Sodium: 139 mmol/L (ref 134–144)
Total Protein: 7.2 g/dL (ref 6.0–8.5)
eGFR: 91 mL/min/{1.73_m2} (ref >59)

## 2023-12-26 LAB — LIPID PANEL
Chol/HDL Ratio: 3.6 ratio (ref 0.0–4.4)
Cholesterol, Total: 200 mg/dL — ABNORMAL HIGH (ref 100–199)
HDL: 56 mg/dL (ref >39)
LDL Chol Calc (NIH): 126 mg/dL — ABNORMAL HIGH (ref 0–99)
Triglycerides: 101 mg/dL (ref 0–149)
VLDL Cholesterol Cal: 18 mg/dL (ref 5–40)

## 2023-12-26 LAB — TSH: TSH: 1.17 u[IU]/mL (ref 0.450–4.500)

## 2024-01-04 NOTE — Pre-Procedure Instructions (Signed)
 Surgical Instructions   Your procedure is scheduled on January 11, 2024. Report to [facility] Main Entrance A at 5:30 A.M., then check in with the Admitting office. Any questions or running late day of surgery: call [phone]  Questions prior to your surgery date: call [phone], Monday-Friday, 8am-4pm. If you experience any cold or flu symptoms such as cough, fever, chills, shortness of breath, etc. between now and your scheduled surgery, please notify us  at the above number.     Remember:  Do not eat after midnight the night before your surgery   You may drink clear liquids until 4:30 AM the morning of your surgery.   Clear liquids allowed are: Water, Non-Citrus Juices (without pulp), Carbonated Beverages, Clear Tea (no milk, honey, etc.), Black Coffee Only (NO MILK, CREAM OR POWDERED CREAMER of any kind), and Gatorade.  Patient Instructions  The night before surgery:  No food after midnight. ONLY clear liquids after midnight  The day of surgery (if you do NOT have diabetes):  Drink ONE (1) Pre-Surgery Clear Ensure by 4:30 AM the morning of surgery. Drink in one sitting. Do not sip.  This drink was given to you during your hospital  pre-op appointment visit.  Nothing else to drink after completing the  Pre-Surgery Clear Ensure.    Take these medicines the morning of surgery with A SIP OF WATER: gabapentin  (NEURONTIN )    May take these medicines IF NEEDED: LORazepam  (ATIVAN )  traMADol  (ULTRAM )    One week prior to surgery, STOP taking any Aspirin (unless otherwise instructed by your surgeon) Aleve, Naproxen, Ibuprofen, Motrin, Advil, Goody's, BC's, all herbal medications, fish oil, and non-prescription vitamins.                     Do NOT Smoke (Tobacco/Vaping) for 24 hours prior to your procedure.  If you use a CPAP at night, you may bring your mask/headgear for your overnight stay.   You will be asked to remove any contacts, glasses, piercing's, hearing aid's,  dentures/partials prior to surgery. Please bring cases for these items if needed.    Patients discharged the day of surgery will not be allowed to drive home, and someone needs to stay with them for 24 hours.  SURGICAL WAITING ROOM VISITATION Patients may have no more than 2 support people in the waiting area - these visitors may rotate.   Pre-op nurse will coordinate an appropriate time for 1 ADULT support person, who may not rotate, to accompany patient in pre-op.  Children under the age of 34 must have an adult with them who is not the patient and must remain in the main waiting area with an adult.  If the patient needs to stay at the hospital during part of their recovery, the visitor guidelines for inpatient rooms apply.  Please refer to the [facility] website for the visitor guidelines for any additional information.   If you received a COVID test during your pre-op visit  it is requested that you wear a mask when out in public, stay away from anyone that may not be feeling well and notify your surgeon if you develop symptoms. If you have been in contact with anyone that has tested positive in the last 10 days please notify you surgeon.      Pre-operative CHG Bathing Instructions   You can play a key role in reducing the risk of infection after surgery. Your skin needs to be as free of germs as possible. You can  reduce the number of germs on your skin by washing with CHG (chlorhexidine gluconate) soap before surgery. CHG is an antiseptic soap that kills germs and continues to kill germs even after washing.   DO NOT use if you have an allergy to chlorhexidine/CHG or antibacterial soaps. If your skin becomes reddened or irritated, stop using the CHG and notify one of our RNs at [phone].              TAKE A SHOWER THE NIGHT BEFORE SURGERY AND THE DAY OF SURGERY    Please keep in mind the following:  DO NOT shave, including legs and underarms, 48 hours prior to surgery.   You may  shave your face before/day of surgery.  Place clean sheets on your bed the night before surgery Use a clean washcloth (not used since being washed) for each shower. DO NOT sleep with pet's night before surgery.  CHG Shower Instructions:  Wash your face and private area with normal soap. If you choose to wash your hair, wash first with your normal shampoo.  After you use shampoo/soap, rinse your hair and body thoroughly to remove shampoo/soap residue.  Turn the water OFF and apply half the bottle of CHG soap to a CLEAN washcloth.  Apply CHG soap ONLY FROM YOUR NECK DOWN TO YOUR TOES (washing for 3-5 minutes)  DO NOT use CHG soap on face, private areas, open wounds, or sores.  Pay special attention to the area where your surgery is being performed.  If you are having back surgery, having someone wash your back for you may be helpful. Wait 2 minutes after CHG soap is applied, then you may rinse off the CHG soap.  Pat dry with a clean towel  Put on clean pajamas    Additional instructions for the day of surgery: DO NOT APPLY any lotions, deodorants, cologne, or perfumes.   Do not wear jewelry or makeup Do not wear nail polish, gel polish, artificial nails, or any other type of covering on natural nails (fingers and toes) Do not bring valuables to the hospital. [facility] is not responsible for valuables/personal belongings. Put on clean/comfortable clothes.  Please brush your teeth.  Ask your nurse before applying any prescription medications to the skin.

## 2024-01-05 ENCOUNTER — Other Ambulatory Visit: Payer: Self-pay

## 2024-01-05 ENCOUNTER — Encounter (HOSPITAL_COMMUNITY): Payer: Self-pay

## 2024-01-05 ENCOUNTER — Encounter (HOSPITAL_COMMUNITY)
Admission: RE | Admit: 2024-01-05 | Discharge: 2024-01-05 | Disposition: A | Discharge status: Home / Self Care | Source: Ambulatory Visit | Attending: General Surgery

## 2024-01-05 VITALS — BP 107/79 | HR 77 | Temp 98.2°F | Resp 18 | Ht 67.0 in | Wt 236.0 lb

## 2024-01-05 DIAGNOSIS — Z01818 Encounter for other preprocedural examination: Secondary | ICD-10-CM | POA: Diagnosis not present

## 2024-01-05 DIAGNOSIS — Z01812 Encounter for preprocedural laboratory examination: Secondary | ICD-10-CM | POA: Diagnosis present

## 2024-01-05 DIAGNOSIS — I2510 Atherosclerotic heart disease of native coronary artery without angina pectoris: Secondary | ICD-10-CM | POA: Diagnosis not present

## 2024-01-05 DIAGNOSIS — D0512 Intraductal carcinoma in situ of left breast: Secondary | ICD-10-CM | POA: Insufficient documentation

## 2024-01-05 DIAGNOSIS — Z01810 Encounter for preprocedural cardiovascular examination: Secondary | ICD-10-CM | POA: Diagnosis present

## 2024-01-05 DIAGNOSIS — I10 Essential (primary) hypertension: Secondary | ICD-10-CM | POA: Insufficient documentation

## 2024-01-05 HISTORY — DX: Anxiety disorder, unspecified: F41.9

## 2024-01-05 LAB — POCT PREGNANCY, URINE: Preg Test, Ur: NEGATIVE

## 2024-01-05 NOTE — Progress Notes (Signed)
 PCP - Aileen Alexanders, NP Cardiologist - Denies  PPM/ICD - Denies Device Orders - n/a Rep Notified - n/a  Chest x-ray - n/a EKG - 01/05/2024 Stress Test - Denies ECHO - Denies Cardiac Cath - Denies  Sleep Study - Denies CPAP - n/a  No DM  Last dose of GLP1 agonist- n/a GLP1 instructions: n/a  Blood Thinner Instructions: n/a Aspirin Instructions: n/a  ERAS Protcol - Clear liquids until 0430 morning of surgery PRE-SURGERY Ensure or G2- Ensure given to pt with instructions  COVID TEST- n/a   Anesthesia review: Yes. Breast seed placement 6/17   Patient denies shortness of breath, fever, cough and chest pain at PAT appointment. Pt denies any respiratory illness/infection in the last two months.    All instructions explained to the patient, with a verbal understanding of the material. Patient agrees to go over the instructions while at home for a better understanding. Patient also instructed to self quarantine after being tested for COVID-19. The opportunity to ask questions was provided.

## 2024-01-08 NOTE — Anesthesia Preprocedure Evaluation (Signed)
 Anesthesia Evaluation  Patient identified by MRN, date of birth, ID band Patient awake    Reviewed: Allergy & Precautions, NPO status , Patient's Chart, lab work & pertinent test results  Airway Mallampati: III  TM Distance: >3 FB Neck ROM: Full    Dental  (+) Teeth Intact, Dental Advisory Given   Pulmonary neg pulmonary ROS   breath sounds clear to auscultation       Cardiovascular hypertension, Pt. on medications  Rhythm:Regular Rate:Normal     Neuro/Psych   Anxiety     negative neurological ROS     GI/Hepatic negative GI ROS, Neg liver ROS,,,  Endo/Other  negative endocrine ROS    Renal/GU negative Renal ROS     Musculoskeletal negative musculoskeletal ROS (+)    Abdominal   Peds  Hematology negative hematology ROS (+)   Anesthesia Other Findings   Reproductive/Obstetrics                             Anesthesia Physical Anesthesia Plan  ASA: 2  Anesthesia Plan: General   Post-op Pain Management: Tylenol PO (pre-op)* and Toradol IV (intra-op)*   Induction: Intravenous  PONV Risk Score and Plan: 4 or greater and Ondansetron, Dexamethasone, Midazolam and Scopolamine patch - Pre-op  Airway Management Planned: LMA  Additional Equipment: None  Intra-op Plan:   Post-operative Plan: Extubation in OR  Informed Consent: I have reviewed the patients History and Physical, chart, labs and discussed the procedure including the risks, benefits and alternatives for the proposed anesthesia with the patient or authorized representative who has indicated his/her understanding and acceptance.     Dental advisory given  Plan Discussed with: CRNA  Anesthesia Plan Comments: (PAT note written 01/08/2024 by Rhealynn Myhre, PA-C.  )       Anesthesia Quick Evaluation

## 2024-01-08 NOTE — Progress Notes (Signed)
 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   RSL scheduled for 01/09/24. Urine pregnancy test negative on 01/05/24.  Anesthesia team to evaluate on the day of surgery.    VS: BP 107/79   Pulse 77   Temp 36.8 C   Resp 18   Ht 5' 7 (1.702 m)   Wt 107 kg   SpO2 99%   BMI 36.96 kg/m   PROVIDERS: Aileen Alexanders, NP is PCP  She has upcoming visits with Darlene Dance, MD with Darlene [name] and Darlene Langdon, MD is [facility]   LABS: Most recent labs in [facility] include: Lab Results  Component Value Date   WBC 7.2 12/25/2023   HGB 14.0 12/25/2023   HCT 43.6 12/25/2023   PLT 292 12/25/2023   GLUCOSE 90 12/25/2023   CHOL 200 (H) 12/25/2023   TRIG 101 12/25/2023   HDL 56 12/25/2023   LDLCALC 126 (H) 12/25/2023   ALT 23 12/25/2023   AST 22 12/25/2023   NA 139 12/25/2023   K 4.1 12/25/2023   CL 99 12/25/2023   CREATININE 0.79 12/25/2023   BUN 11 12/25/2023   CO2 24 12/25/2023   TSH 1.170 12/25/2023    Labs Reviewed  POCT PREGNANCY, URINE  Urine pregnancy test was negative.    EKG: 01/05/24: Normal sinus rhythm with sinus arrhythmia Normal ECG Confirmed by Darlene [name] [phone]) on 01/05/2024 10:16:33 PM  CV: N/A  Past Medical History:  Diagnosis Date   Ankle pain    Anxiety    Cancer (HCC) 2025   Left Breast Cancer   Hypertension     Past Surgical History:  Procedure Laterality Date   ABLATION     uterus   ACHILLES TENDON REPAIR Right 2016   ACHILLES TENDON  REPAIR Right 2017   BREAST BIOPSY Left 10/23/2023   Stereo bx, X-clip posterior, path pending   BREAST BIOPSY Left 10/23/2023   Stereo bx, coil clip anterior, path pending   BREAST BIOPSY Left 10/23/2023   MM LT BREAST BX W LOC DEV 1ST LESION IMAGE BX SPEC STEREO GUIDE 10/23/2023 [facility]   BREAST BIOPSY Left 10/23/2023   MM LT BREAST BX W LOC DEV EA AD LESION IMG BX SPEC STEREO GUIDE 10/23/2023 [facility]   BREAST BIOPSY Left 11/07/2023   MM LT BREAST BX W LOC DEV 1ST LESION IMAGE BX SPEC STEREO GUIDE 11/07/2023 [facility]   COLONOSCOPY WITH PROPOFOL  N/A 06/11/2021   Procedure: COLONOSCOPY WITH PROPOFOL ;  Surgeon: Irby Mannan, MD;  Location: [facility] ENDOSCOPY;  Service: Endoscopy;  Laterality: N/A;   TONSILLECTOMY     As a child    MEDICATIONS:  gabapentin  (NEURONTIN ) 300 MG capsule   LORazepam  (ATIVAN ) 0.5 MG tablet   traMADol  (ULTRAM ) 50 MG tablet   valsartan  (DIOVAN ) 40 MG tablet   No current facility-administered medications for this encounter.    Darlene Gun, PA-C Surgical Short Stay/Anesthesiology [facility] Phone [phone] [facility] Phone [phone] 01/08/2024 1:00 PM

## 2024-01-09 ENCOUNTER — Other Ambulatory Visit: Payer: Self-pay | Admitting: General Surgery

## 2024-01-09 ENCOUNTER — Ambulatory Visit
Admission: RE | Admit: 2024-01-09 | Discharge: 2024-01-09 | Disposition: A | Discharge status: Home / Self Care | Source: Ambulatory Visit | Attending: General Surgery | Admitting: General Surgery

## 2024-01-09 DIAGNOSIS — D0512 Intraductal carcinoma in situ of left breast: Secondary | ICD-10-CM

## 2024-01-09 HISTORY — PX: BREAST BIOPSY: SHX20

## 2024-01-10 ENCOUNTER — Encounter (HOSPITAL_COMMUNITY): Payer: Self-pay | Admitting: General Surgery

## 2024-01-10 NOTE — H&P (Signed)
 34 yof who underwent screening mm. She has b density breast tissue. She has no FH and has undergone negative genetic testing since dx. She has no mass or dc. 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 The MM showed calcs in the left breast. There are two groups of calcs noted measuring 16 and 5 mm for total of 28 mm. Biopsy of these sties showed DCIS int to high grade that are ER pos at 90%. She then had an MRI that showed nl right breast, no abnormal nodes and the left breast shows postbiopsy changes at the loq sites but this enhancement by MR extends over 4.2 cm in length just behind the areola with indeterminate 1.4 cm nme involving the outer left breast. This could not be reproduced but thought to correlate with some calcs that were biopsied. This area underwent biopsy and is int grade DCIS 90% er pos. She is her to discuss options and is very motivated to avoid mastectomy  Review of Systems: A complete review of systems was obtained from the patient. I have reviewed this information and discussed as appropriate with the patient. See HPI as well for other ROS.  Review of Systems  All other systems reviewed and are negative.   Medical History: Past Medical History:  Diagnosis Date  Anxiety  Breast cancer (CMS/HHS-HCC)  Hypertension  10 yrs ago   Patient Active Problem List  Diagnosis  Bursitis of left hip  Chronic pain of right ankle  Obesity (BMI 35.0-39.9 without comorbidity), unspecified  Abnormal uterine bleeding (AUB)  Anxiety  Primary hypertension   Past Surgical History:  Procedure Laterality Date  achilles 2016, 2017  surgery   No Known Allergies  Current Outpatient Medications on File Prior to Visit  Medication Sig Dispense Refill  gabapentin  (NEURONTIN ) 300 MG capsule Take 1 capsule by mouth 3 (three) times daily  LORazepam  (ATIVAN ) 0.5 MG tablet Take 0.5 mg by mouth 2 (two) times daily as needed  traMADoL  (ULTRAM ) 50 mg tablet Take 100 mg by mouth every 6 (six) hours  as needed  VALSARTAN  ORAL Take by mouth Unsure of dosage  etonogestreL (NEXPLANON) 68 mg implant Inject subcutaneously (Patient not taking: Reported on 10/31/2023)   Family History  Problem Relation Age of Onset  Diabetes Mother  Coronary Artery Disease (Blocked arteries around heart) Father  Diabetes Father  Colon cancer Father   Social History   Tobacco Use  Smoking Status Never  Smokeless Tobacco Never  Marital status: Married  Tobacco Use  Smoking status: Never  Smokeless tobacco: Never  Substance and Sexual Activity  Alcohol use: Not Currently  Alcohol/week: 1.0 standard drink of alcohol  Types: 1 Glasses of wine per week  Comment: once a week  Drug use: Never  Sexual activity: Yes  Partners: Male  Birth control/protection: Implant  Comment: Nexplanon 08/2017    Objective:   BP: (!) 160/98  Pulse: 92  Temp: 37 C (98.6 F)  SpO2: 98%  Weight: (!) 108.1 kg (238 lb 6.4 oz)  Height: 170.2 cm (5' 7)  PainSc: 0-No pain  PainLoc: Breast   Body mass index is 37.34 kg/m.  Physical Exam Constitutional:  Appearance: Normal appearance.  Chest:  Breasts: Right: No inverted nipple, mass or nipple discharge.  Left: No inverted nipple, mass or nipple discharge.  Lymphadenopathy:  Upper Body:  Right upper body: No supraclavicular or axillary adenopathy.  Left upper body: No supraclavicular or axillary adenopathy.  Neurological:  Mental Status: She is alert.  Assessment and Plan:   Ductal carcinoma in situ (DCIS) of left breast  Left breast seed bracketed lumpectomy  We discussed the staging and pathophysiology of breast cancer. We discussed all of the different options for treatment for breast cancer including surgery, radiation therapy, and antiestrogen therapy.  We discussed a sentinel lymph node biopsy and I do not think she needs one at this time due to DCIS.  We discussed the options for treatment of the breast cancer which included lumpectomy versus  a mastectomy. We discussed the performance of the lumpectomy with multiple radioactive seed placements. We discussed a 10% chance of a positive margin requiring reexcision in the operating room. We also discussed if her nipple margin is positive we might need to return to OR to excise this. We also discussed that she might end up needing mastectomy. We also discussed that she will likely need radiation therapy if she undergoes lumpectomy. We discussed mastectomy and the postoperative care for that as well. Mastectomy can be followed by reconstruction.. Most mastectomy patients will not need radiation therapy. We discussed that there is no difference in her survival whether she undergoes lumpectomy with radiation therapy or antiestrogen therapy versus a mastectomy. There is also no real difference between her recurrence in the breast. We will schedule for right when she returns from [place] for her 48th bday trip  We discussed the risks of operation including bleeding, infection, possible reoperation. She understands her further therapy will be based on what her stages at the time of her operation.

## 2024-01-11 ENCOUNTER — Ambulatory Visit (HOSPITAL_COMMUNITY)
Admission: RE | Admit: 2024-01-11 | Discharge: 2024-01-11 | Disposition: A | Discharge status: Home / Self Care | Attending: General Surgery | Admitting: General Surgery

## 2024-01-11 ENCOUNTER — Ambulatory Visit

## 2024-01-11 ENCOUNTER — Other Ambulatory Visit: Payer: Self-pay

## 2024-01-11 ENCOUNTER — Ambulatory Visit (HOSPITAL_COMMUNITY): Payer: Self-pay | Admitting: Certified Registered Nurse Anesthetist

## 2024-01-11 ENCOUNTER — Ambulatory Visit
Admission: RE | Admit: 2024-01-11 | Discharge: 2024-01-11 | Disposition: A | Discharge status: Home / Self Care | Source: Ambulatory Visit | Attending: General Surgery | Admitting: General Surgery

## 2024-01-11 ENCOUNTER — Ambulatory Visit (HOSPITAL_COMMUNITY): Payer: Self-pay | Admitting: Vascular Surgery

## 2024-01-11 ENCOUNTER — Encounter (HOSPITAL_COMMUNITY)
Admission: RE | Disposition: A | Discharge status: Home / Self Care | Payer: Self-pay | Source: Home / Self Care | Attending: General Surgery

## 2024-01-11 ENCOUNTER — Encounter: Payer: Self-pay | Admitting: Nurse Practitioner

## 2024-01-11 DIAGNOSIS — I10 Essential (primary) hypertension: Secondary | ICD-10-CM | POA: Insufficient documentation

## 2024-01-11 DIAGNOSIS — N6489 Other specified disorders of breast: Secondary | ICD-10-CM | POA: Insufficient documentation

## 2024-01-11 DIAGNOSIS — N6092 Unspecified benign mammary dysplasia of left breast: Secondary | ICD-10-CM | POA: Insufficient documentation

## 2024-01-11 DIAGNOSIS — F419 Anxiety disorder, unspecified: Secondary | ICD-10-CM | POA: Insufficient documentation

## 2024-01-11 DIAGNOSIS — N641 Fat necrosis of breast: Secondary | ICD-10-CM | POA: Diagnosis not present

## 2024-01-11 DIAGNOSIS — D0512 Intraductal carcinoma in situ of left breast: Secondary | ICD-10-CM | POA: Insufficient documentation

## 2024-01-11 DIAGNOSIS — Z170 Estrogen receptor positive status [ER+]: Secondary | ICD-10-CM | POA: Diagnosis not present

## 2024-01-11 DIAGNOSIS — Z79899 Other long term (current) drug therapy: Secondary | ICD-10-CM | POA: Diagnosis not present

## 2024-01-11 HISTORY — PX: BREAST LUMPECTOMY WITH RADIOACTIVE SEED LOCALIZATION: SHX6424

## 2024-01-11 SURGERY — BREAST LUMPECTOMY WITH RADIOACTIVE SEED LOCALIZATION
Anesthesia: General | Site: Breast | Laterality: Left

## 2024-01-11 MED ORDER — OXYCODONE HCL 5 MG PO TABS: 5.0000 mg | ORAL_TABLET | ORAL | Status: DC | PRN

## 2024-01-11 MED ORDER — ACETAMINOPHEN 325 MG PO TABS: 650.0000 mg | ORAL_TABLET | ORAL | Status: DC | PRN

## 2024-01-11 MED ORDER — SCOPOLAMINE 1 MG/3DAYS TD PT72
1.0000 | MEDICATED_PATCH | TRANSDERMAL | Status: DC
Start: 2024-01-11 — End: 2024-01-11
  Administered 2024-01-11: 1 via TRANSDERMAL

## 2024-01-11 MED ORDER — PROPOFOL 10 MG/ML IV BOLUS
INTRAVENOUS | Status: DC | PRN
  Administered 2024-01-11: 200 mg via INTRAVENOUS

## 2024-01-11 MED ORDER — ORAL CARE MOUTH RINSE: 15.0000 mL | Freq: Once | OROMUCOSAL | Status: AC

## 2024-01-11 MED ORDER — OXYCODONE HCL 5 MG/5ML PO SOLN: 5.0000 mg | Freq: Once | ORAL | Status: AC | PRN

## 2024-01-11 MED ORDER — ACETAMINOPHEN 500 MG PO TABS
1000.0000 mg | ORAL_TABLET | ORAL | Status: AC
  Administered 2024-01-11: 1000 mg via ORAL
  Filled 2024-01-11: qty 2

## 2024-01-11 MED ORDER — LIDOCAINE 2% (20 MG/ML) 5 ML SYRINGE
INTRAMUSCULAR | Status: DC | PRN
  Administered 2024-01-11: 40 mg via INTRAVENOUS

## 2024-01-11 MED ORDER — BUPIVACAINE-EPINEPHRINE (PF) 0.25% -1:200000 IJ SOLN
INTRAMUSCULAR | Status: AC
  Filled 2024-01-11: qty 30

## 2024-01-11 MED ORDER — CEFAZOLIN SODIUM-DEXTROSE 2-4 GM/100ML-% IV SOLN
2.0000 g | INTRAVENOUS | Status: AC
  Administered 2024-01-11: 2 g via INTRAVENOUS
  Filled 2024-01-11: qty 100

## 2024-01-11 MED ORDER — LACTATED RINGERS IV SOLN: INTRAVENOUS | Status: DC

## 2024-01-11 MED ORDER — CHLORHEXIDINE GLUCONATE 0.12 % MT SOLN
15.0000 mL | Freq: Once | OROMUCOSAL | Status: AC
  Administered 2024-01-11: 15 mL via OROMUCOSAL
  Filled 2024-01-11: qty 15

## 2024-01-11 MED ORDER — ACETAMINOPHEN 10 MG/ML IV SOLN: 1000.0000 mg | Freq: Once | INTRAVENOUS | Status: DC | PRN

## 2024-01-11 MED ORDER — PROPOFOL 10 MG/ML IV BOLUS
INTRAVENOUS | Status: AC
  Filled 2024-01-11: qty 20

## 2024-01-11 MED ORDER — DROPERIDOL 2.5 MG/ML IJ SOLN: 0.6250 mg | Freq: Once | INTRAMUSCULAR | Status: DC | PRN

## 2024-01-11 MED ORDER — ENSURE PRE-SURGERY PO LIQD: 296.0000 mL | Freq: Once | ORAL | Status: DC

## 2024-01-11 MED ORDER — FENTANYL CITRATE (PF) 250 MCG/5ML IJ SOLN
INTRAMUSCULAR | Status: DC | PRN
  Administered 2024-01-11: 100 ug via INTRAVENOUS
  Administered 2024-01-11: 25 ug via INTRAVENOUS

## 2024-01-11 MED ORDER — ACETAMINOPHEN 650 MG RE SUPP: 650.0000 mg | RECTAL | Status: DC | PRN

## 2024-01-11 MED ORDER — CHLORHEXIDINE GLUCONATE CLOTH 2 % EX PADS: 6.0000 | MEDICATED_PAD | Freq: Once | CUTANEOUS | Status: DC

## 2024-01-11 MED ORDER — FENTANYL CITRATE (PF) 250 MCG/5ML IJ SOLN
INTRAMUSCULAR | Status: AC
  Filled 2024-01-11: qty 5

## 2024-01-11 MED ORDER — DEXAMETHASONE SODIUM PHOSPHATE 10 MG/ML IJ SOLN
INTRAMUSCULAR | Status: DC | PRN
  Administered 2024-01-11: 10 mg via INTRAVENOUS

## 2024-01-11 MED ORDER — 0.9 % SODIUM CHLORIDE (POUR BTL) OPTIME
TOPICAL | Status: DC | PRN
  Administered 2024-01-11: 1000 mL

## 2024-01-11 MED ORDER — SODIUM CHLORIDE 0.9% FLUSH: 3.0000 mL | INTRAVENOUS | Status: DC | PRN

## 2024-01-11 MED ORDER — OXYCODONE HCL 5 MG PO TABS
ORAL_TABLET | ORAL | Status: AC
  Filled 2024-01-11: qty 1

## 2024-01-11 MED ORDER — SODIUM CHLORIDE 0.9% FLUSH: 3.0000 mL | Freq: Two times a day (BID) | INTRAVENOUS | Status: DC

## 2024-01-11 MED ORDER — KETOROLAC TROMETHAMINE 15 MG/ML IJ SOLN
15.0000 mg | INTRAMUSCULAR | Status: AC
  Administered 2024-01-11: 15 mg via INTRAVENOUS
  Filled 2024-01-11: qty 1

## 2024-01-11 MED ORDER — OXYCODONE HCL 5 MG PO TABS
5.0000 mg | ORAL_TABLET | Freq: Once | ORAL | Status: AC | PRN
  Administered 2024-01-11: 5 mg via ORAL

## 2024-01-11 MED ORDER — FENTANYL CITRATE (PF) 100 MCG/2ML IJ SOLN: 25.0000 ug | INTRAMUSCULAR | Status: DC | PRN

## 2024-01-11 MED ORDER — PHENYLEPHRINE 80 MCG/ML (10ML) SYRINGE FOR IV PUSH (FOR BLOOD PRESSURE SUPPORT)
PREFILLED_SYRINGE | INTRAVENOUS | Status: DC | PRN
  Administered 2024-01-11: 80 ug via INTRAVENOUS

## 2024-01-11 MED ORDER — MIDAZOLAM HCL 2 MG/2ML IJ SOLN
INTRAMUSCULAR | Status: AC
Start: 2024-01-11 — End: 2024-01-11
  Filled 2024-01-11: qty 2

## 2024-01-11 MED ORDER — ONDANSETRON HCL 4 MG/2ML IJ SOLN
INTRAMUSCULAR | Status: DC | PRN
  Administered 2024-01-11: 4 mg via INTRAVENOUS

## 2024-01-11 MED ORDER — BUPIVACAINE-EPINEPHRINE 0.25% -1:200000 IJ SOLN
INTRAMUSCULAR | Status: DC | PRN
  Administered 2024-01-11: 10 mL

## 2024-01-11 MED ORDER — SODIUM CHLORIDE 0.9 % IV SOLN: 250.0000 mL | INTRAVENOUS | Status: DC | PRN

## 2024-01-11 MED ORDER — MIDAZOLAM HCL 2 MG/2ML IJ SOLN
INTRAMUSCULAR | Status: DC | PRN
  Administered 2024-01-11: 2 mg via INTRAVENOUS

## 2024-01-11 SURGICAL SUPPLY — 34 items
BAG COUNTER SPONGE SURGICOUNT (BAG) ×1 IMPLANT
BINDER BREAST LRG (GAUZE/BANDAGES/DRESSINGS) IMPLANT
BINDER BREAST XLRG (GAUZE/BANDAGES/DRESSINGS) IMPLANT
BINDER BREAST XXLRG (GAUZE/BANDAGES/DRESSINGS) IMPLANT
CANISTER SUCTION 3000ML PPV (SUCTIONS) ×1 IMPLANT
CHLORAPREP W/TINT 26 (MISCELLANEOUS) ×1 IMPLANT
CLIP APPLIE 9.375 MED OPEN (MISCELLANEOUS) IMPLANT
CLIP TI MEDIUM 6 (CLIP) IMPLANT
COVER PROBE W GEL 5X96 (DRAPES) ×1 IMPLANT
COVER SURGICAL LIGHT HANDLE (MISCELLANEOUS) ×1 IMPLANT
DERMABOND ADVANCED .7 DNX12 (GAUZE/BANDAGES/DRESSINGS) ×1 IMPLANT
DEVICE DUBIN SPECIMEN MAMMOGRA (MISCELLANEOUS) ×1 IMPLANT
DRAPE CHEST BREAST 15X10 FENES (DRAPES) ×1 IMPLANT
ELECT COATED BLADE 2.86 ST (ELECTRODE) ×1 IMPLANT
ELECTRODE REM PT RTRN 9FT ADLT (ELECTROSURGICAL) ×1 IMPLANT
GLOVE BIO SURGEON STRL SZ7 (GLOVE) ×2 IMPLANT
GLOVE BIOGEL PI IND STRL 7.5 (GLOVE) ×1 IMPLANT
GOWN STRL REUS W/ TWL LRG LVL3 (GOWN DISPOSABLE) ×2 IMPLANT
KIT BASIN OR (CUSTOM PROCEDURE TRAY) ×1 IMPLANT
KIT MARKER MARGIN INK (KITS) ×1 IMPLANT
LIGHT WAVEGUIDE WIDE FLAT (MISCELLANEOUS) IMPLANT
NDL HYPO 25GX1X1/2 BEV (NEEDLE) ×1 IMPLANT
NEEDLE HYPO 25GX1X1/2 BEV (NEEDLE) ×1 IMPLANT
NS IRRIG 1000ML POUR BTL (IV SOLUTION) ×1 IMPLANT
PACK GENERAL/GYN (CUSTOM PROCEDURE TRAY) ×1 IMPLANT
STRIP CLOSURE SKIN 1/2X4 (GAUZE/BANDAGES/DRESSINGS) ×1 IMPLANT
SUT MNCRL AB 4-0 PS2 18 (SUTURE) ×1 IMPLANT
SUT MON AB 5-0 PS2 18 (SUTURE) IMPLANT
SUT SILK 2 0 SH (SUTURE) IMPLANT
SUT VIC AB 2-0 SH 27XBRD (SUTURE) ×1 IMPLANT
SUT VIC AB 3-0 SH 27X BRD (SUTURE) ×1 IMPLANT
SYR CONTROL 10ML LL (SYRINGE) ×1 IMPLANT
TOWEL GREEN STERILE (TOWEL DISPOSABLE) ×1 IMPLANT
TOWEL GREEN STERILE FF (TOWEL DISPOSABLE) ×1 IMPLANT

## 2024-01-11 NOTE — Discharge Instructions (Signed)
 [facility] Office Phone Number [phone]  POST OP INSTRUCTIONS Take 400 mg of ibuprofen every 8 hours or 650 mg tylenol every 6 hours for next 72 hours then as needed. Use ice several times daily also.  A prescription for pain medication may be given to you upon discharge.  Take your pain medication as prescribed, if needed.  If narcotic pain medicine is not needed, then you may take acetaminophen (Tylenol), naprosyn (Alleve) or ibuprofen (Advil) as needed. Take your usually prescribed medications unless otherwise directed If you need a refill on your pain medication, please contact your pharmacy.  They will contact our office to request authorization.  Prescriptions will not be filled after 5pm or on week-ends. You should eat very light the first 24 hours after surgery, such as soup, crackers, pudding, etc.  Resume your normal diet the day after surgery. Most patients will experience some swelling and bruising in the breast.  Ice packs and a good support bra will help.  Wear the breast binder provided or a sports bra for 72 hours day and night.  After that wear a sports bra during the day until you return to the office. Swelling and bruising can take several days to resolve.  It is common to experience some constipation if taking pain medication after surgery.  Increasing fluid intake and taking a stool softener will usually help or prevent this problem from occurring.  A mild laxative (Milk of Magnesia or Miralax) should be taken according to package directions if there are no bowel movements after 48 hours. I used skin glue on the incision, you may shower in 24 hours.  The glue will flake off over the next 2-3 weeks.  Any sutures or staples will be removed at the office during your follow-up visit. ACTIVITIES:  You may resume regular daily activities (gradually increasing) beginning the next day.  Wearing a good support bra or sports bra minimizes pain and swelling.  You may have  sexual intercourse when it is comfortable. You may drive when you no longer are taking prescription pain medication, you can comfortably wear a seatbelt, and you can safely maneuver your car and apply brakes. RETURN TO WORK:  ______________________________________________________________________________________ Bonita Quin should see your doctor in the office for a follow-up appointment approximately two weeks after your surgery.  Your doctor's nurse will typically make your follow-up appointment when she calls you with your pathology report.  Expect your pathology report 3-4 business days after your surgery.  You may call to check if you do not hear from [place] after three days. OTHER INSTRUCTIONS: _______________________________________________________________________________________________ _____________________________________________________________________________________________________________________________________ _____________________________________________________________________________________________________________________________________ _____________________________________________________________________________________________________________________________________  WHEN TO CALL DR Alaija Ruble: Fever over 101.0 Nausea and/or vomiting. Extreme swelling or bruising. Continued bleeding from incision. Increased pain, redness, or drainage from the incision.  The clinic staff is available to answer your questions during regular business hours.  Please don't hesitate to call and ask to speak to one of the nurses for clinical concerns.  If you have a medical emergency, go to the nearest emergency room or call 911.  A surgeon from [facility] is always on call at the hospital.  For further questions, please visit [email or web address]

## 2024-01-11 NOTE — Anesthesia Postprocedure Evaluation (Signed)
 Anesthesia Post Note  Patient: Caroljean Monsivais  Procedure(s) Performed: BREAST LUMPECTOMY WITH RADIOACTIVE SEED LOCALIZATION (Left: Breast)     Patient location during evaluation: PACU Anesthesia Type: General Level of consciousness: awake and alert Pain management: pain level controlled Vital Signs Assessment: post-procedure vital signs reviewed and stable Respiratory status: spontaneous breathing, nonlabored ventilation, respiratory function stable and patient connected to nasal cannula oxygen  Cardiovascular status: blood pressure returned to baseline and stable Postop Assessment: no apparent nausea or vomiting Anesthetic complications: no  No notable events documented.  Last Vitals:  Vitals:   01/11/24 0900 01/11/24 0915  BP: 122/79 123/78  Pulse: 71 63  Resp: 12 14  Temp:  36.7 C  SpO2: 98% 97%    Last Pain:  Vitals:   01/11/24 0900  TempSrc:   PainSc: 3                  Willian Harrow

## 2024-01-11 NOTE — Op Note (Signed)
 Preoperative diagnosis: Left breast ductal carcinoma in situ Postoperative diagnosis: Same as above Procedure: Left breast seed bracketed lumpectomy Surgical Dr. Donavan Fuchs Anesthesia: General Complications: None Drains: None Specimens: 1.  Left retroareolar lumpectomy marked with paint containing 2 seeds and 2 clips 2.  Additional anterior left retroareolar lumpectomy margin marked short stitch superior, long stitch lateral, double stitch deep 3.  Left upper outer quadrant lumpectomy which is contiguous with the inferior portion of the other lumpectomy marked with paint containing seed and clip 4.  Additional inferior and lateral margins of left upper outer quadrant lumpectomy marked with short stitch superior, long stitch lateral Estimated blood loss: Minimal Sponge no counts correct completion Disposition recovery stable condition  Indications:47 yof who underwent screening mm. She has b density breast tissue. She has no FH and has undergone negative genetic testing since dx. The MM showed calcs in the left breast. There are two groups of calcs noted measuring 16 and 5 mm for total of 28 mm. Biopsy of these sties showed DCIS int to high grade that are ER pos at 90%. She then had an MRI that showed nl right breast, no abnormal nodes and the left breast shows postbiopsy changes at the loq sites but this enhancement by MR extends over 4.2 cm in length just behind the areola with indeterminate 1.4 cm nme involving the outer left breast. This could not be reproduced but thought to correlate with some calcs that were biopsied. This area underwent biopsy and is int grade DCIS 90% er pos. She is her to discuss options and is very motivated to avoid mastectomy we discussed a bracketed lumpectomy.  Procedure: After informed consent was obtained she was taken to the operating room.  She had had 3 seeds placed prior and I discussed this with radiology.  She was given antibiotics.  SCDs were placed.  She  was placed under general anesthesia without complication.  She was prepped and draped in the standard sterile surgical fashion.  Surgical timeout was then performed.  I located all of the seeds.  I made a periareolar incision.  I then excised the retroareolar seeds with an attempt to get a clear margin.  Mammogram confirmed removal of both of these.  As well as the clips.  This look close to my anterior margin and it appeared that the DCIS might go up to the nipple on imaging.  So I took the anterior margin which is all of the retroareolar tissue behind the nipple.  This was marked with sutures.  I then located the other seed.  This was actually fairly close to my inferior margin and I included that in the neck specimen.  I remove the seed and the clip with an attempt a get a clear margin again.  Mammogram confirmed removal of the seed and the clip.  I thought I was close to the inferior and lateral margins so I remove these.  These were marked as above.  Hemostasis was then obtained.  I then spent some time pulling the breast tissue back together.  With this was done with 2-0 Vicryl.  The skin was closed with 3-0 Vicryl after placing some clips.  5-0 Monocryl was used to close then.  Glue and Steri-Strips were applied.  She tolerated this well was extubated and transferred to recovery stable.

## 2024-01-11 NOTE — Anesthesia Procedure Notes (Signed)
 Procedure Name: LMA Insertion Date/Time: 01/11/2024 7:40 AM  Performed by: Laroy Plunk, CRNAPre-anesthesia Checklist: Patient identified, Emergency Drugs available, Suction available and Patient being monitored Patient Re-evaluated:Patient Re-evaluated prior to induction Oxygen  Delivery Method: Circle System Utilized Preoxygenation: Pre-oxygenation with 100% oxygen  Induction Type: IV induction LMA: LMA inserted LMA Size: 4.0 Number of attempts: 1 Placement Confirmation: positive ETCO2 Tube secured with: Tape Dental Injury: Teeth and Oropharynx as per pre-operative assessment

## 2024-01-11 NOTE — Transfer of Care (Signed)
 Immediate Anesthesia Transfer of Care Note  Patient: Darlene [name]  Procedure(s) Performed: BREAST LUMPECTOMY WITH RADIOACTIVE SEED LOCALIZATION (Left: Breast)  Patient Location: PACU  Anesthesia Type:General  Level of Consciousness: drowsy  Airway & Oxygen  Therapy: Patient Spontanous Breathing  Post-op Assessment: Report given to RN and Post -op Vital signs reviewed and stable  Post vital signs: Reviewed and stable  Last Vitals:  Vitals Value Taken Time  BP 120/74 01/11/24 08:42  Temp 36.6 C 01/11/24 08:42  Pulse 77 01/11/24 08:42  Resp 10 01/11/24 08:42  SpO2 94 % 01/11/24 08:42  Vitals shown include unfiled device data.  Last Pain:  Vitals:   01/11/24 0651  TempSrc:   PainSc: 0-No pain         Complications: No notable events documented.

## 2024-01-12 ENCOUNTER — Encounter (HOSPITAL_COMMUNITY): Payer: Self-pay | Admitting: General Surgery

## 2024-01-12 LAB — SURGICAL PATHOLOGY

## 2024-01-24 ENCOUNTER — Encounter: Payer: Self-pay | Admitting: Oncology

## 2024-01-24 ENCOUNTER — Inpatient Hospital Stay

## 2024-01-24 ENCOUNTER — Ambulatory Visit
Admission: RE | Admit: 2024-01-24 | Discharge: 2024-01-24 | Disposition: A | Discharge status: Home / Self Care | Source: Ambulatory Visit | Attending: Radiation Oncology | Admitting: Radiation Oncology

## 2024-01-24 ENCOUNTER — Inpatient Hospital Stay (HOSPITAL_BASED_OUTPATIENT_CLINIC_OR_DEPARTMENT_OTHER): Admitting: Oncology

## 2024-01-24 ENCOUNTER — Inpatient Hospital Stay: Admitting: Occupational Therapy

## 2024-01-24 VITALS — BP 137/85 | HR 75 | Temp 97.6°F | Resp 19 | Ht 67.0 in | Wt 233.9 lb

## 2024-01-24 DIAGNOSIS — Z79899 Other long term (current) drug therapy: Secondary | ICD-10-CM | POA: Insufficient documentation

## 2024-01-24 DIAGNOSIS — D0512 Intraductal carcinoma in situ of left breast: Secondary | ICD-10-CM

## 2024-01-24 DIAGNOSIS — E785 Hyperlipidemia, unspecified: Secondary | ICD-10-CM | POA: Insufficient documentation

## 2024-01-24 DIAGNOSIS — I10 Essential (primary) hypertension: Secondary | ICD-10-CM | POA: Insufficient documentation

## 2024-01-24 DIAGNOSIS — Z7189 Other specified counseling: Secondary | ICD-10-CM

## 2024-01-24 DIAGNOSIS — M25612 Stiffness of left shoulder, not elsewhere classified: Secondary | ICD-10-CM

## 2024-01-24 DIAGNOSIS — Z170 Estrogen receptor positive status [ER+]: Secondary | ICD-10-CM | POA: Insufficient documentation

## 2024-01-24 DIAGNOSIS — Z510 Encounter for antineoplastic radiation therapy: Secondary | ICD-10-CM | POA: Diagnosis present

## 2024-01-24 NOTE — Consult Note (Signed)
 NEW PATIENT EVALUATION  Name: Darlene [name]  MRN: [id number]  Date:   01/24/2024     DOB: 01/24/1976   This 48 y.o. 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  REFERRING PHYSICIAN: Melanee Annah BROCKS, MD  CHIEF COMPLAINT:  Chief Complaint  Patient presents with   Breast Cancer    DIAGNOSIS: The encounter diagnosis was Intraductal carcinoma in situ of left breast.   PREVIOUS INVESTIGATIONS:  Mammograms ultrasound and MRI scans reviewed Clinical notes reviewed Pathology reports reviewed  HPI: Patient is a 48 year old female who was found on screening mammogram to have calcifications warranting further investigations of her left breast.  Diagnostic mammogram showed 2 groups of indeterminate calcifications of the left breast measuring 16 and 5 mm respectively for estimated span of 2.8 cm.  She underwent stereotactic guided biopsy which was positive for ER positive ductal carcinoma in situ.  Breast MRI scan demonstrated indeterminate 1.4 cm non-mass enhancement along the outer left breast at posterior depth.  No MRI evidence of malignancy involving the right breast.  Patient underwent wide local excision of both areas.  DCIS spanned 3.5 x 1.4 x 0.8 cm.  Tumor was intermediate nuclear grade.  Tumor was strongly ER positive.  Secondary is also removed through wide local excision again showing 1.7 x 0.9 x 0.8 cm intermediate grade ductal carcinoma in situ.  On the first lumpectomy DCIS present at the anterior margin on the second specimen DCIS was present at the lateral margin.  She underwent further margin excision in the retroareolar anterior margin which showed again show 0.25 mm new anterior margin.  No invasive carcinoma was seen.  Lateral margin also was reexcised again showing DCIS 0.7 mm from the new lateral margin.  There was also upper outer quadrant inferior  margin excised showing no evidence of malignancy.  She has done well postoperatively.  Has been seen by medical oncology with recommendation for endocrine therapy after completion of radiation.  She is seeing Dr. Ebbie in follow-up next week.  She specifically denies significant breast tenderness cough or bone pain.  PLANNED TREATMENT REGIMEN: Left whole breast radiation DIBH with boost for close margin  PAST MEDICAL HISTORY:  has a past medical history of Ankle pain, Anxiety, Cancer (HCC) (2025), and Hypertension.    PAST SURGICAL HISTORY:  Past Surgical History:  Procedure Laterality Date   ABLATION     uterus   ACHILLES TENDON REPAIR Right 2016   ACHILLES TENDON REPAIR Right 2017   BREAST BIOPSY Left 10/23/2023   Stereo bx, X-clip posterior, path pending   BREAST BIOPSY Left 10/23/2023   Stereo bx, coil clip anterior, path pending   BREAST BIOPSY Left 10/23/2023   MM LT BREAST BX W LOC DEV 1ST LESION IMAGE BX SPEC STEREO GUIDE 10/23/2023 [facility]   BREAST BIOPSY Left 10/23/2023   MM LT BREAST BX W LOC DEV EA AD LESION IMG BX SPEC STEREO GUIDE 10/23/2023 [facility]   BREAST BIOPSY Left 11/07/2023   MM LT BREAST BX W LOC DEV 1ST LESION IMAGE BX SPEC STEREO GUIDE 11/07/2023 [facility]   BREAST BIOPSY  01/09/2024   MM LT RADIOACTIVE SEED LOC MAMMO GUIDE 01/09/2024 [facility]   BREAST BIOPSY  01/09/2024   MM LT RADIOACTIVE SEED EA ADD LESION LOC MAMMO GUIDE 01/09/2024 [facility]   BREAST BIOPSY  01/09/2024   MM LT RADIOACTIVE SEED EA  ADD LESION LOC MAMMO GUIDE 01/09/2024 [facility]   BREAST LUMPECTOMY WITH RADIOACTIVE SEED LOCALIZATION Left 01/11/2024   Procedure: BREAST LUMPECTOMY WITH RADIOACTIVE SEED LOCALIZATION;  Surgeon: Ebbie Cough, MD;  Location: [facility] OR;  Service: General;  Laterality: Left;  LEFT BREAST SEED BRACKETED LUMPECTOMY   COLONOSCOPY WITH PROPOFOL  N/A 06/11/2021   Procedure: COLONOSCOPY WITH PROPOFOL ;  Surgeon: Janalyn Keene NOVAK, MD;  Location: [facility];  Service: Endoscopy;  Laterality: N/A;   TONSILLECTOMY     As a child    FAMILY HISTORY: family history includes Colon cancer (age of onset: 87) in her father; Diabetes in her father, maternal grandmother, and mother; Heart disease in her father; Lung cancer in her maternal grandfather; Thyroid disease in her mother.  SOCIAL HISTORY:  reports that she has never smoked. She has never used smokeless tobacco. She reports current alcohol use. She reports that she does not use drugs.  ALLERGIES: Patient has no known allergies.  MEDICATIONS:  Current Outpatient Medications  Medication Sig Dispense Refill   gabapentin  (NEURONTIN ) 300 MG capsule Take 1 tab 3x daily 270 capsule 1   LORazepam  (ATIVAN ) 0.5 MG tablet Take 1 tablet (0.5 mg total) by mouth 2 (two) times daily as needed for anxiety. 30 tablet 0   traMADol  (ULTRAM ) 50 MG tablet TAKE 2 TABLETS BY MOUTH EVERY 6 HOURS AS NEEDED 40 tablet 0   valsartan  (DIOVAN ) 40 MG tablet Take 1 tablet (40 mg total) by mouth daily. 90 tablet 1   No current facility-administered medications for this encounter.    ECOG PERFORMANCE STATUS:  0 - Asymptomatic  REVIEW OF SYSTEMS: Patient denies any weight loss, fatigue, weakness, fever, chills or night sweats. Patient denies any loss of vision, blurred vision. Patient denies any ringing  of the ears or hearing loss. No irregular heartbeat. Patient denies heart murmur or history of fainting. Patient denies any chest pain or pain radiating to her upper extremities. Patient denies any shortness of breath, difficulty breathing at night, cough or hemoptysis. Patient denies any swelling in the lower legs. Patient denies any nausea vomiting, vomiting of blood, or coffee ground material in the vomitus. Patient denies any stomach pain. Patient states has had normal bowel movements no significant constipation or diarrhea. Patient denies any dysuria, hematuria or significant nocturia.  Patient denies any problems walking, swelling in the joints or loss of balance. Patient denies any skin changes, loss of hair or loss of weight. Patient denies any excessive worrying or anxiety or significant depression. Patient denies any problems with insomnia. Patient denies excessive thirst, polyuria, polydipsia. Patient denies any swollen glands, patient denies easy bruising or easy bleeding. Patient denies any recent infections, allergies or URI. Patient s visual fields have not changed significantly in recent time.   PHYSICAL EXAM: There were no vitals taken for this visit. Patient is status post wide local excision of the left breast.  Incision is healed well.  No dominant masses noted in either breast no axillary or supraclavicular adenopathy is appreciated.  Well-developed well-nourished patient in NAD. HEENT reveals PERLA, EOMI, discs not visualized.  Oral cavity is clear. No oral mucosal lesions are identified. Neck is clear without evidence of cervical or supraclavicular adenopathy. Lungs are clear to A&P. Cardiac examination is essentially unremarkable with regular rate and rhythm without murmur rub or thrill. Abdomen is benign with no organomegaly or masses noted. Motor sensory and DTR levels are equal and symmetric in the upper and lower extremities. Cranial nerves II through XII are  grossly intact. Proprioception is intact. No peripheral adenopathy or edema is identified. No motor or sensory levels are noted. Crude visual fields are within normal range.  LABORATORY DATA: Pathology reports reviewed    RADIOLOGY RESULTS: Mammograms ultrasound and MRI scans reviewed compatible with above-stated findings   IMPRESSION: Multifocal DCIS of the left breast status post wide local excision of both sites with close margin after reexcision in 48 year old female  PLAN: At this time I have recommended whole breast radiation therapy.  Do not think she needs further excision although she will see Dr.  Ebbie next week I believe he and medical oncology are both on board for proceeding with radiation at this time.  I would treat her whole breast hypofractionated course over 3 weeks with deep inspiration breath-hold technique.  Would also boost her scar another 1600 centigrade for the close margins.  Risks and benefits of treatment clued skin reaction fatigue alteration blood counts possible inclusion of superficial lung all were reviewed in detail with the patient.  I have set her up for simulation the week after seeing Dr. Ebbie.  Patient also be a candidate for endocrine therapy after completion of radiation.  Patient comprehends my recommendations and rationale well.  I would like to take this opportunity to thank you for allowing me to participate in the care of your patient.SABRA Marcey Penton, MD

## 2024-01-24 NOTE — Progress Notes (Signed)
 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  This was followed by diagnostic mammogram which showed 2 groups of indeterminate calcifications in the left breast 16 mm and 5 mm respectively for an estimated span of 28 mm.  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   Patient underwent lumpectomy on 01/11/2024.  Final pathology showed 3.6 x 1.4 x 0.8 cm DCIS.  Final anterior margin was close at 0.25 mm and the rest of margins were negative.  ER +90%.  Focal areas of comedonecrosis.  Genetic testing negative.  Interval history-patient still reports soreness at the site of lumpectomy but is overall feeling better since her surgery.  ECOG PS- 0 Pain scale- 3   Review of systems- Review of Systems  Constitutional:  Negative for chills, fever, malaise/fatigue and weight loss.  HENT:  Negative for congestion, ear discharge and nosebleeds.   Eyes:  Negative for blurred vision.  Respiratory:  Negative for cough, hemoptysis, sputum production, shortness of breath and wheezing.   Cardiovascular:  Negative for chest pain, palpitations, orthopnea and claudication.  Gastrointestinal:  Negative for abdominal pain, blood in  stool, constipation, diarrhea, heartburn, melena, nausea and vomiting.  Genitourinary:  Negative for dysuria, flank pain, frequency, hematuria and urgency.  Musculoskeletal:  Negative for back pain, joint pain and myalgias.  Skin:  Negative for rash.  Neurological:  Negative for dizziness, tingling, focal weakness, seizures, weakness and headaches.  Endo/Heme/Allergies:  Does not bruise/bleed easily.  Psychiatric/Behavioral:  Negative for depression and suicidal ideas. The patient does not have insomnia.       No Known Allergies   Past Medical History:  Diagnosis Date   Ankle pain    Anxiety    Cancer (HCC) 2025   Left Breast Cancer   Hypertension      Past Surgical History:  Procedure Laterality Date   ABLATION     uterus   ACHILLES TENDON REPAIR Right 2016   ACHILLES TENDON REPAIR Right 2017   BREAST BIOPSY Left 10/23/2023   Stereo bx, X-clip posterior, path pending   BREAST BIOPSY Left 10/23/2023   Stereo bx, coil clip anterior, path pending   BREAST BIOPSY Left 10/23/2023   MM LT BREAST BX W LOC DEV 1ST LESION IMAGE BX SPEC STEREO GUIDE 10/23/2023 [facility]   BREAST BIOPSY Left 10/23/2023   MM LT BREAST BX W LOC DEV EA AD LESION IMG BX SPEC STEREO GUIDE 10/23/2023 [facility]   BREAST BIOPSY Left 11/07/2023   MM LT BREAST BX W LOC DEV 1ST LESION IMAGE BX SPEC STEREO GUIDE 11/07/2023 [facility]   BREAST BIOPSY  01/09/2024   MM LT RADIOACTIVE SEED LOC MAMMO GUIDE 01/09/2024 [facility]   BREAST BIOPSY  01/09/2024   MM LT RADIOACTIVE SEED EA ADD LESION LOC MAMMO GUIDE 01/09/2024 [facility]   BREAST BIOPSY  01/09/2024  MM LT RADIOACTIVE SEED EA ADD LESION LOC MAMMO GUIDE 01/09/2024 [facility]   BREAST LUMPECTOMY WITH RADIOACTIVE SEED LOCALIZATION Left 01/11/2024   Procedure: BREAST LUMPECTOMY WITH RADIOACTIVE SEED LOCALIZATION;  Surgeon: Ebbie Cough, MD;  Location: [facility];  Service: General;  Laterality: Left;  LEFT BREAST SEED  BRACKETED LUMPECTOMY   COLONOSCOPY WITH PROPOFOL  N/A 06/11/2021   Procedure: COLONOSCOPY WITH PROPOFOL ;  Surgeon: Janalyn Keene NOVAK, MD;  Location: [facility];  Service: Endoscopy;  Laterality: N/A;   TONSILLECTOMY     As a child    Social History   Socioeconomic History   Marital status: Married    Spouse name: Not on file   Number of children: Not on file   Years of education: Not on file   Highest education level: Bachelor's degree (e.g., BA, AB, BS)  Occupational History   Not on file  Tobacco Use   Smoking status: Never   Smokeless tobacco: Never  Vaping Use   Vaping status: Never Used  Substance and Sexual Activity   Alcohol use: Yes    Comment: socially   Drug use: Never   Sexual activity: Yes    Partners: Male    Birth control/protection: Implant  Other Topics Concern   Not on file  Social History Narrative   Patient lives with husband. Recently relocated to [facility] from [place], and prior to that lived in [place] . Patient is from [place] and her family continues to reside there.    Social Drivers of Corporate investment banker Strain: Low Risk  (09/13/2023)   Overall Financial Resource Strain (CARDIA)    Difficulty of Paying Living Expenses: Not hard at all  Food Insecurity: No Food Insecurity (10/27/2023)   Hunger Vital Sign    Worried About Running Out of Food in the Last Year: Never true    Ran Out of Food in the Last Year: Never true  Transportation Needs: No Transportation Needs (10/27/2023)   PRAPARE - Administrator, Civil Service (Medical): No    Lack of Transportation (Non-Medical): No  Physical Activity: Sufficiently Active (09/13/2023)   Exercise Vital Sign    Days of Exercise per Week: 3 days    Minutes of Exercise per Session: 60 min  Stress: Stress Concern Present (09/13/2023)   [employer] of Occupational Health - Occupational Stress Questionnaire    Feeling of Stress : To some extent  Social Connections: Unknown (09/13/2023)   Social  Connection and Isolation Panel    Frequency of Communication with Friends and Family: Twice a week    Frequency of Social Gatherings with Friends and Family: Patient declined    Attends Religious Services: Never    Database administrator or Organizations: Yes    Attends Engineer, structural: More than 4 times per year    Marital Status: Married  Catering manager Violence: Not At Risk (10/27/2023)   Humiliation, Afraid, Rape, and Kick questionnaire    Fear of Current or Ex-Partner: No    Emotionally Abused: No    Physically Abused: No    Sexually Abused: No    Family History  Problem Relation Age of Onset   Diabetes Mother    Thyroid disease Mother    Colon cancer Father 78   Diabetes Father    Heart disease Father    Diabetes Maternal Grandmother    Lung cancer Maternal Grandfather    Breast cancer Neg Hx      Current Outpatient Medications:  gabapentin  (NEURONTIN ) 300 MG capsule, Take 1 tab 3x daily, Disp: 270 capsule, Rfl: 1   LORazepam  (ATIVAN ) 0.5 MG tablet, Take 1 tablet (0.5 mg total) by mouth 2 (two) times daily as needed for anxiety., Disp: 30 tablet, Rfl: 0   traMADol  (ULTRAM ) 50 MG tablet, TAKE 2 TABLETS BY MOUTH EVERY 6 HOURS AS NEEDED, Disp: 40 tablet, Rfl: 0   valsartan  (DIOVAN ) 40 MG tablet, Take 1 tablet (40 mg total) by mouth daily., Disp: 90 tablet, Rfl: 1  Physical exam:  Vitals:   01/24/24 1005 01/24/24 1010  BP: (!) 148/87 137/85  Pulse: 75   Resp: 19   Temp: 97.6 F (36.4 C)   TempSrc: Tympanic   SpO2: 100%   Weight: 233 lb 14.4 oz (106.1 kg)   Height: 5' 7 (1.702 m)    Physical Exam Cardiovascular:     Rate and Rhythm: Normal rate and regular rhythm.     Heart sounds: Normal heart sounds.  Pulmonary:     Effort: Pulmonary effort is normal.     Breath sounds: Normal breath sounds.  Abdominal:     General: Bowel sounds are normal.     Palpations: Abdomen is soft.  Skin:    General: Skin is warm and dry.  Neurological:      Mental Status: She is alert and oriented to person, place, and time.      I have personally reviewed labs listed below:    Latest Ref Rng & Units 12/25/2023    8:55 AM  CMP  Glucose 70 - 99 mg/dL 90   BUN 6 - 24 mg/dL 11   Creatinine 9.42 - 1.00 mg/dL 9.20   Sodium 865 - 855 mmol/L 139   Potassium 3.5 - 5.2 mmol/L 4.1   Chloride 96 - 106 mmol/L 99   CO2 20 - 29 mmol/L 24   Calcium 8.7 - 10.2 mg/dL 9.2   Total Protein 6.0 - 8.5 g/dL 7.2   Total Bilirubin 0.0 - 1.2 mg/dL 0.5   Alkaline Phos 44 - 121 IU/L 22   AST 0 - 40 IU/L 22   ALT 0 - 32 IU/L 23       Latest Ref Rng & Units 12/25/2023    8:55 AM  CBC  WBC 3.4 - 10.8 x10E3/uL 7.2   Hemoglobin 11.1 - 15.9 g/dL 85.9   Hematocrit 65.9 - 46.6 % 43.6   Platelets 150 - 450 x10E3/uL 292    I have personally reviewed Radiology images listed below: No images are attached to the encounter.  MM Breast Surgical Specimen Result Date: 01/11/2024 CLINICAL DATA:  Evaluate surgical specimens following lumpectomies for LEFT breast DCIS EXAM: SPECIMEN RADIOGRAPH OF THE LEFT BREAST x 2 COMPARISON:  Previous exam(s). FINDINGS: Status post excision of the LEFT breast. In the 1st specimen, the radioactive seed and RIBBON biopsy clip are present and intact. In the 2nd specimen, the 2 radioactive seeds, X biopsy clip and COIL biopsy clip are present and intact. IMPRESSION: Specimen radiographs of the LEFT breast. Electronically Signed   By: Reyes Phi M.D.   On: 01/11/2024 13:55   MM Breast Surgical Specimen Result Date: 01/11/2024 CLINICAL DATA:  Evaluate surgical specimens following lumpectomies for LEFT breast DCIS EXAM: SPECIMEN RADIOGRAPH OF THE LEFT BREAST x 2 COMPARISON:  Previous exam(s). FINDINGS: Status post excision of the LEFT breast. In the 1st specimen, the radioactive seed and RIBBON biopsy clip are present and intact. In the 2nd specimen, the 2 radioactive seeds, X  biopsy clip and COIL biopsy clip are present and intact. IMPRESSION:  Specimen radiographs of the LEFT breast. Electronically Signed   By: Reyes Phi M.D.   On: 01/11/2024 13:55   MM LT RADIOACTIVE SEED LOC MAMMO GUIDE Result Date: 01/09/2024 CLINICAL DATA:  Patient presents for radioactive seed localization of 3 areas of left breast DCIS prior to surgical excision. EXAM: MAMMOGRAPHIC GUIDED RADIOACTIVE SEED LOCALIZATION OF THE LEFT BREAST: 3 RADIOACTIVE SEEDS PLACED. COMPARISON:  Previous exam(s). FINDINGS: Patient presents for radioactive seed localization prior to surgical excision. I met with the patient and we discussed the procedure of seed localization including benefits and alternatives. We discussed the high likelihood of a successful procedure. We discussed the risks of the procedure including infection, bleeding, tissue injury and further surgery. We discussed the low dose of radioactivity involved in the procedure. Informed, written consent was given. The usual time-out protocol was performed immediately prior to the procedure. Coil Clip Using mammographic guidance, sterile technique, 1% lidocaine  and an I-125 radioactive seed, the coil shaped post biopsy marker clip was localized using a lateral approach. The follow-up mammogram images confirm the seed in the expected location and were marked for Dr. Ebbie. Follow-up survey of the patient confirms presence of the radioactive seed. Order number of I-125 seed:  [id number]. Total activity:  0.238 millicuries.  Reference Date: 12/19/2023. X Clip Using mammographic guidance, sterile technique, 1% lidocaine  and an I-125 radioactive seed, the X shaped post biopsy marker clip was localized using a lateral approach. The follow-up mammogram images confirm the seed in the expected location and were marked for Dr. Ebbie. Follow-up survey of the patient confirms presence of the radioactive seed. Order number of I-125 seed:  [id number]. Total activity:  0.238 millicuries reference Date: 12/19/2023. Ribbon Clip Using  mammographic guidance, sterile technique, 1% lidocaine  and an I-125 radioactive seed, the ribbon shaped post biopsy marker clip was localized using a lateral approach. The follow-up mammogram images confirm the seed in the expected location and were marked for Dr. Ebbie. Follow-up survey of the patient confirms presence of the radioactive seed. Order number of I-125 seed:  [id number]. Total activity:  0.245 millicuries.  Reference Date: 11/06/2023. The patient tolerated the procedure well and was [facility] from the [facility]. She was given instructions regarding seed removal. IMPRESSION: Radioactive seed localization of the left breast. 3 post biopsy marker clips were localized with 3 radioactive seeds. No apparent complications. Electronically Signed   By: Alm Parkins M.D.   On: 01/09/2024 12:01   MM LT RAD SEED EA ADD LESION LOC MAMMO Result Date: 01/09/2024 CLINICAL DATA:  Patient presents for radioactive seed localization of 3 areas of left breast DCIS prior to surgical excision. EXAM: MAMMOGRAPHIC GUIDED RADIOACTIVE SEED LOCALIZATION OF THE LEFT BREAST: 3 RADIOACTIVE SEEDS PLACED. COMPARISON:  Previous exam(s). FINDINGS: Patient presents for radioactive seed localization prior to surgical excision. I met with the patient and we discussed the procedure of seed localization including benefits and alternatives. We discussed the high likelihood of a successful procedure. We discussed the risks of the procedure including infection, bleeding, tissue injury and further surgery. We discussed the low dose of radioactivity involved in the procedure. Informed, written consent was given. The usual time-out protocol was performed immediately prior to the procedure. Coil Clip Using mammographic guidance, sterile technique, 1% lidocaine  and an I-125 radioactive seed, the coil shaped post biopsy marker clip was localized using a lateral approach. The follow-up mammogram images confirm the seed in the expected  location  and were marked for Dr. Ebbie. Follow-up survey of the patient confirms presence of the radioactive seed. Order number of I-125 seed:  [id number]. Total activity:  0.238 millicuries.  Reference Date: 12/19/2023. X Clip Using mammographic guidance, sterile technique, 1% lidocaine  and an I-125 radioactive seed, the X shaped post biopsy marker clip was localized using a lateral approach. The follow-up mammogram images confirm the seed in the expected location and were marked for Dr. Ebbie. Follow-up survey of the patient confirms presence of the radioactive seed. Order number of I-125 seed:  [id number]. Total activity:  0.238 millicuries reference Date: 12/19/2023. Ribbon Clip Using mammographic guidance, sterile technique, 1% lidocaine  and an I-125 radioactive seed, the ribbon shaped post biopsy marker clip was localized using a lateral approach. The follow-up mammogram images confirm the seed in the expected location and were marked for Dr. Ebbie. Follow-up survey of the patient confirms presence of the radioactive seed. Order number of I-125 seed:  [id number]. Total activity:  0.245 millicuries.  Reference Date: 11/06/2023. The patient tolerated the procedure well and was released from the [facility]. She was given instructions regarding seed removal. IMPRESSION: Radioactive seed localization of the left breast. 3 post biopsy marker clips were localized with 3 radioactive seeds. No apparent complications. Electronically Signed   By: Alm Parkins M.D.   On: 01/09/2024 12:01   MM LT RAD SEED EA ADD LESION LOC MAMMO Result Date: 01/09/2024 CLINICAL DATA:  Patient presents for radioactive seed localization of 3 areas of left breast DCIS prior to surgical excision. EXAM: MAMMOGRAPHIC GUIDED RADIOACTIVE SEED LOCALIZATION OF THE LEFT BREAST: 3 RADIOACTIVE SEEDS PLACED. COMPARISON:  Previous exam(s). FINDINGS: Patient presents for radioactive seed localization prior to surgical excision. I met with  the patient and we discussed the procedure of seed localization including benefits and alternatives. We discussed the high likelihood of a successful procedure. We discussed the risks of the procedure including infection, bleeding, tissue injury and further surgery. We discussed the low dose of radioactivity involved in the procedure. Informed, written consent was given. The usual time-out protocol was performed immediately prior to the procedure. Coil Clip Using mammographic guidance, sterile technique, 1% lidocaine  and an I-125 radioactive seed, the coil shaped post biopsy marker clip was localized using a lateral approach. The follow-up mammogram images confirm the seed in the expected location and were marked for Dr. Ebbie. Follow-up survey of the patient confirms presence of the radioactive seed. Order number of I-125 seed:  [id number]. Total activity:  0.238 millicuries.  Reference Date: 12/19/2023. X Clip Using mammographic guidance, sterile technique, 1% lidocaine  and an I-125 radioactive seed, the X shaped post biopsy marker clip was localized using a lateral approach. The follow-up mammogram images confirm the seed in the expected location and were marked for Dr. Ebbie. Follow-up survey of the patient confirms presence of the radioactive seed. Order number of I-125 seed:  [id number]. Total activity:  0.238 millicuries reference Date: 12/19/2023. Ribbon Clip Using mammographic guidance, sterile technique, 1% lidocaine  and an I-125 radioactive seed, the ribbon shaped post biopsy marker clip was localized using a lateral approach. The follow-up mammogram images confirm the seed in the expected location and were marked for Dr. Ebbie. Follow-up survey of the patient confirms presence of the radioactive seed. Order number of I-125 seed:  [id number]. Total activity:  0.245 millicuries.  Reference Date: 11/06/2023. The patient tolerated the procedure well and was released from the [facility]. She was  given instructions regarding seed removal. IMPRESSION: Radioactive seed localization of  the left breast. 3 post biopsy marker clips were localized with 3 radioactive seeds. No apparent complications. Electronically Signed   By: Alm Parkins M.D.   On: 01/09/2024 12:01     Assessment and plan- Patient is a 48 y.o. female with history of left breast DCIS ER positive here to discuss final pathology results and further management   Discussed results of lumpectomy pathology with the patient in detail which showed 3.6 cm intermediate grade DCIS with comedonecrosis.  Focal anterior margin was close at 0.25 mm.  She is meeting with radiation oncology to discuss adjuvant radiation and may get radiation boost to that margin.  As per [facility] DCIS nomogram her 5 and 10-year probability of recurrence is 18% and 27% respectively given the close margins.  Adjuvant radiation therapy will bring this risk down to 7 and 11% respectively and adjuvant endocrine therapy will further bring it down to 3 and 5% respectively.  Patient has not had menstrual cycles for a few years now since she was on birth control which was taken off when I met her for her initial visit.  She is also undergoing uterine ablation.  I am checking Her hormone levels today FSH and estradiol and if they are confirmed to be postmenopausal I will send her a prescription for letrozole.  Discussed risks and benefits of letrozole including all but not limited to hot flashes mood swings arthralgias and worsening bone health as well as vaginal dryness and hypercholesterolemia.  Patient understands and agrees to proceed as planned.  She will start taking letrozole only after completion of radiation.  If her hormone levels are premenopausal I will send her prescription for tamoxifen.  I will see her back in 3 months with labs CMP and a bone density scan prior.  Treatment will be given with curative intent   Cancer Staging  Ductal carcinoma in situ (DCIS) of left  breast Staging form: Breast, AJCC 8th Edition - Clinical stage from 01/24/2024: Stage 0 (cTis (DCIS), cN0, cM0, G2, ER+, PR: Not Assessed, HER2: Not Assessed) - Signed by Melanee Annah BROCKS, MD on 01/24/2024 Histologic grading system: 3 grade system      Visit Diagnosis 1. Ductal carcinoma in situ (DCIS) of left breast   2. Goals of care, counseling/discussion      Dr. Annah Melanee, MD, MPH [facility] [phone] 01/24/2024 12:58 PM

## 2024-01-24 NOTE — Progress Notes (Signed)
 Patient states she's still having a little bit of pain after having surgery, but overall she reports that she has no other concerns at this time.

## 2024-01-24 NOTE — Therapy (Signed)
 OUTPATIENT OCCUPATIONAL THERAPY BREAST CANCER POST OP  EVALUATION   Patient Name: Darlene [name] MRN: [id number] DOB:1975/11/14, 48 y.o., female Today's Date: 01/24/2024  END OF SESSION:  OT End of Session - 01/24/24 1230     Visit Number 1    Number of Visits 3    Date for OT Re-Evaluation 04/17/24    OT Start Time 1035    OT Stop Time 1059    OT Time Calculation (min) 24 min    Activity Tolerance Patient tolerated treatment well    Behavior During Therapy WFL for tasks assessed/performed          Past Medical History:  Diagnosis Date   Ankle pain    Anxiety    Cancer (HCC) 2025   Left Breast Cancer   Hypertension    Past Surgical History:  Procedure Laterality Date   ABLATION     uterus   ACHILLES TENDON REPAIR Right 2016   ACHILLES TENDON REPAIR Right 2017   BREAST BIOPSY Left 10/23/2023   Stereo bx, X-clip posterior, path pending   BREAST BIOPSY Left 10/23/2023   Stereo bx, coil clip anterior, path pending   BREAST BIOPSY Left 10/23/2023   MM LT BREAST BX W LOC DEV 1ST LESION IMAGE BX SPEC STEREO GUIDE 10/23/2023 [facility]   BREAST BIOPSY Left 10/23/2023   MM LT BREAST BX W LOC DEV EA AD LESION IMG BX SPEC STEREO GUIDE 10/23/2023 [facility]   BREAST BIOPSY Left 11/07/2023   MM LT BREAST BX W LOC DEV 1ST LESION IMAGE BX SPEC STEREO GUIDE 11/07/2023 [facility]   BREAST BIOPSY  01/09/2024   MM LT RADIOACTIVE SEED LOC MAMMO GUIDE 01/09/2024 [facility]   BREAST BIOPSY  01/09/2024   MM LT RADIOACTIVE SEED EA ADD LESION LOC MAMMO GUIDE 01/09/2024 [facility]   BREAST BIOPSY  01/09/2024   MM LT RADIOACTIVE SEED EA ADD LESION LOC MAMMO GUIDE 01/09/2024 [facility]   BREAST LUMPECTOMY WITH RADIOACTIVE SEED LOCALIZATION Left 01/11/2024   Procedure: BREAST LUMPECTOMY WITH RADIOACTIVE SEED LOCALIZATION;  Surgeon: Ebbie Cough, MD;  Location: [facility] OR;  Service: General;  Laterality: Left;  LEFT BREAST SEED BRACKETED LUMPECTOMY    COLONOSCOPY WITH PROPOFOL  N/A 06/11/2021   Procedure: COLONOSCOPY WITH PROPOFOL ;  Surgeon: Janalyn Keene NOVAK, MD;  Location: [facility];  Service: Endoscopy;  Laterality: N/A;   TONSILLECTOMY     As a child   Patient Active Problem List   Diagnosis Date Noted   Genetic testing 11/13/2023   Hyperlipidemia 05/18/2022   Anxiety 12/21/2021   Family history of colon cancer    Rectal polyp    Abnormal uterine bleeding (AUB) 04/15/2021   Primary hypertension 01/05/2021   Chronic pain of right ankle 03/30/2018   Obesity (BMI 35.0-39.9 without comorbidity) 03/30/2018   Bursitis of left hip 02/01/2017    PCP: Melvin NP  REFERRING PROVIDER: DR Melanee MART DIAG: L shoulder stiffness  THERAPY DIAG:  Stiffness of left shoulder, not elsewhere classified  Rationale for Evaluation and Treatment: Rehabilitation  ONSET DATE: 01/11/24  SUBJECTIVE:  SUBJECTIVE STATEMENT: Patient reports she is here today after being refer by one of her medical team for her newly diagnosed left breast cancer.   PERTINENT HISTORY:  Patient was diagnosed with left  breast cancer -and had left lumpectomy by Dr. Ebbie on 01/11/2024.  No lymph nodes removed.  Patient met with Dr. Melanee.  Per patient no chemo but radiation.  PATIENT GOALS:   reduce lymphedema risk and learn post op HEP.   PAIN:  Are you having pain? 2/10 when walking and 4/10 L breast with bouncing , hugging  PRECAUTIONS: Active CA     HAND DOMINANCE: right  WEIGHT BEARING RESTRICTIONS: Surgeon precautions  FALLS:  Has patient fallen in last 6 months? 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   OBJECTIVE:  COGNITION: Overall cognitive status: Within functional limits for tasks assessed    POSTURE:  Forward head and rounded shoulders posture  UPPER EXTREMITY AROM/PROM:   Patient arrived with active range of motion within functional limits.  But with endrange discomfort in bilateral shoulders.  Per patient had issues in the past and had therapy in the past for shoulder pain. CERVICAL AROM: All within normal limits:     UPPER EXTREMITY STRENGTH: Not tested 2-3 weeks postop  LYMPHEDEMA ASSESSMENTS:  Not assessed this date.  Patient had no lymph nodes removed. LANDMARK RIGHT   eval  10 cm proximal to olecranon process   Olecranon process   10 cm proximal to ulnar styloid process   Just proximal to ulnar styloid process   Across hand at thumb web space   At base of 2nd digit   (Blank rows = not tested)  LANDMARK LEFT   eval  10 cm proximal to olecranon process   Olecranon process   10 cm proximal to ulnar styloid process   Just proximal to ulnar styloid process   Across hand at thumb web space   At base of 2nd digit   (Blank rows = not tested)  L-DEX LYMPHEDEMA SCREENING:  Patient had no lymph nodes removed.  Did not do L-Dex score  01/24/2024 OT session: Patient was educated in home exercises for active assisted range of motion in supine using the wand for shoulder flexion as well as abduction 12 reps 3 times a day pain-free Patient had no pain in the shoulder slight pull in the axilla.  Patient to keep pain under 1-2/10 Active range of motion with gravity in supine for external rotation 10 reps Patient can do several times during the day and standing scapular squeezes or retraction. Patient was able to get into radiation position today.  Patient has a follow-up appointment with Dr. Ebbie next week   PATIENT EDUCATION:  Education details: Lymphedema risk -no ln removed and post op shoulder/posture HEP Person educated: Patient Education  method: Explanation, Demonstration, Handout Education comprehension: Patient verbalized understanding and returned demonstration     ASSESSMENT:  CLINICAL IMPRESSION: Her multidisciplinary medical team has met to assess and determine a recommended treatment plan.  Patient had left lumpectomy by Dr. Ebbie on 01/11/2024.  Patient met with Dr. Melanee this date.  No chemo but radiation.  Patient has appointment today with Dr. Camelia.  Patient had limited end range of motion for shoulder range of motion overhead.  With some shoulder discomfort.  Patient has a history of  shoulder discomfort or pain and had physical therapy in the past.  Reviewed with patient changing home exercises to active assisted range of motion in supine using the wand.  Patient had no shoulder pain.  Patient to continue with home exercises and follow-up with me first week of radiation.  Patient was able to get into radiation position this date.  She did not had any lymph nodes removed did not do Sozo or L-Dex score - Pt will benefit from skilled therapeutic intervention to improve on the following deficits: Decreased knowledge of precautions and lymphedema education, impaired UE functional use, pain, decreased ROM, postural dysfunction.   OT treatment/interventions: ADL/self-care home management, pt/family education, therapeutic exercise,manual therapy  REHAB POTENTIAL: Good  CLINICAL DECISION MAKING: Stable/uncomplicated  EVALUATION COMPLEXITY: Low   GOALS: Goals reviewed with patient? YES  LONG TERM GOALS: (STG=LTG)    Name Target Date Goal status       2 Pt will be able to return demo and/or verbalize understanding of the post op HEP related to regaining shoulder ROM. Baseline:  No knowledge Today Achieved at eval        4 Pt will demo she has regained full shoulder ROM and function post operatively compared to baselines.  Baseline: See objective measurements taken today. 6 wks Initial    PLAN:  OT  FREQUENCY/DURATION: EVAL and 1-3 follow up appointment as needed  PLAN FOR NEXT SESSION: Will follow-up first week of radiation   Occupational Therapy Information for After Breast Cancer Surgery/Treatment:  Lymphedema is a swelling condition that you may be at risk for in your arm if you have lymph nodes removed from the armpit area.  After a sentinel node biopsy, the risk is approximately 5-9% and is higher after an axillary node dissection.  There is treatment available for this condition and it is not life-threatening.  Contact your physician or occupational therapist with concerns. You may begin the 4 shoulder/posture exercises (see additional sheet) when permitted by your physician (typically a week after surgery).  If you have drains, you may need to wait until those are removed before beginning range of motion exercises.  A general recommendation is to not lift your arms above shoulder height until drains are removed.  These exercises should be done to your tolerance and gently.  This is not a no pain/no gain type of recovery so listen to your body and stretch into the range of motion that you can tolerate, stopping if you have pain.  If you are having immediate reconstruction, ask your plastic surgeon about doing exercises as he or she may want you to wait. .  While undergoing any medical procedure or treatment, try to avoid blood pressure being taken or needle sticks from occurring on the arm on the side of cancer.   This recommendation begins after surgery and continues for the rest of your life.  This may help reduce your risk of getting lymphedema (swelling in your arm). An excellent resource for those seeking information on lymphedema is the National Lymphedema Network's web site. It can be accessed at www.lymphnet.org If you notice swelling in your hand, arm or breast at any time following surgery (even if it is many years from now), please contact your doctor or occupational therapist to  discuss this.  Lymphedema can be treated at any time but it is easier for you if it is treated early on.  If you feel like your shoulder motion is not returning to normal in a reasonable amount  of time, please contact your surgeon or occupational therapist.  [facility] [phone]. [address], [place], [place] [address]  Patient was instructed today in a home exercise program today for post op shoulder range of motion. These included active assist shoulder flexion in standing/supine, scapular retraction, wall walking/slides with shoulder abduction, and hands behind head external rotation in supine.  She was encouraged to do these 2-3 x day, holding 3 seconds and repeating 10 times when permitted by her physician/surgeon      Ancel Peters, OTR/L,CLT 01/24/2024, 12:32 PM

## 2024-01-25 LAB — ESTRADIOL: Estradiol: 55.8 pg/mL

## 2024-01-25 LAB — FOLLICLE STIMULATING HORMONE: FSH: 13.4 m[IU]/mL

## 2024-01-26 ENCOUNTER — Ambulatory Visit: Payer: Self-pay | Admitting: Oncology

## 2024-01-26 ENCOUNTER — Other Ambulatory Visit: Payer: Self-pay | Admitting: Oncology

## 2024-01-26 MED ORDER — TAMOXIFEN CITRATE 20 MG PO TABS: 20.0000 mg | ORAL_TABLET | Freq: Every day | ORAL | 3 refills | Status: DC

## 2024-01-29 NOTE — Telephone Encounter (Signed)
 Spoke with patient via telephone. Discussed that her Hormone levels demonstrate that she is still pre menopausal. Dr. Melanee recommended tamoxifen . I informed her that the prescription was sent in and she will start taking this after completing her radiation therapy. Patient given medication education on Tamoxifen  and I also sent this to her mychart.  ----- Message -----

## 2024-01-29 NOTE — Telephone Encounter (Signed)
-----   Message from Annah JAYSON Skene sent at 01/26/2024  9:35 AM EDT ----- Hormone levels are still pre menopausal. She should therefore do tamoxifen . Prescription sent. She will start that after radiation is completed ----- Message ----- From: Interface, [facility] [place] Sent: 01/25/2024   4:36 PM EDT To: Annah JAYSON Skene, MD

## 2024-02-05 ENCOUNTER — Ambulatory Visit
Admission: RE | Admit: 2024-02-05 | Discharge: 2024-02-05 | Disposition: A | Discharge status: Home / Self Care | Source: Ambulatory Visit | Attending: Radiation Oncology | Admitting: Radiation Oncology

## 2024-02-05 ENCOUNTER — Encounter: Payer: Self-pay | Admitting: *Deleted

## 2024-02-05 DIAGNOSIS — Z510 Encounter for antineoplastic radiation therapy: Secondary | ICD-10-CM | POA: Diagnosis not present

## 2024-02-07 DIAGNOSIS — Z510 Encounter for antineoplastic radiation therapy: Secondary | ICD-10-CM | POA: Diagnosis not present

## 2024-02-13 ENCOUNTER — Ambulatory Visit

## 2024-02-14 ENCOUNTER — Other Ambulatory Visit: Payer: Self-pay | Admitting: *Deleted

## 2024-02-14 ENCOUNTER — Ambulatory Visit

## 2024-02-14 ENCOUNTER — Ambulatory Visit
Admission: RE | Admit: 2024-02-14 | Discharge: 2024-02-14 | Discharge status: Home / Self Care | Source: Ambulatory Visit | Attending: Radiation Oncology | Admitting: Radiation Oncology

## 2024-02-14 DIAGNOSIS — D0512 Intraductal carcinoma in situ of left breast: Secondary | ICD-10-CM

## 2024-02-14 DIAGNOSIS — Z510 Encounter for antineoplastic radiation therapy: Secondary | ICD-10-CM | POA: Diagnosis not present

## 2024-02-15 ENCOUNTER — Encounter: Payer: Self-pay | Admitting: Nurse Practitioner

## 2024-02-15 ENCOUNTER — Ambulatory Visit
Admission: RE | Admit: 2024-02-15 | Discharge: 2024-02-15 | Disposition: A | Discharge status: Home / Self Care | Source: Ambulatory Visit | Attending: Radiation Oncology | Admitting: Radiation Oncology

## 2024-02-15 ENCOUNTER — Other Ambulatory Visit: Payer: Self-pay

## 2024-02-15 DIAGNOSIS — Z510 Encounter for antineoplastic radiation therapy: Secondary | ICD-10-CM | POA: Diagnosis not present

## 2024-02-15 LAB — RAD ONC ARIA SESSION SUMMARY
Course Elapsed Days: 0
Plan Fractions Treated to Date: 1
Plan Prescribed Dose Per Fraction: 2.66 Gy
Plan Total Fractions Prescribed: 16
Plan Total Prescribed Dose: 42.56 Gy
Reference Point Dosage Given to Date: 2.66 Gy
Reference Point Session Dosage Given: 2.66 Gy
Session Number: 1

## 2024-02-16 ENCOUNTER — Other Ambulatory Visit: Payer: Self-pay

## 2024-02-16 ENCOUNTER — Ambulatory Visit
Admission: RE | Admit: 2024-02-16 | Discharge: 2024-02-16 | Disposition: A | Discharge status: Home / Self Care | Source: Ambulatory Visit | Attending: Radiation Oncology | Admitting: Radiation Oncology

## 2024-02-16 DIAGNOSIS — Z510 Encounter for antineoplastic radiation therapy: Secondary | ICD-10-CM | POA: Diagnosis not present

## 2024-02-16 LAB — RAD ONC ARIA SESSION SUMMARY
Course Elapsed Days: 1
Plan Fractions Treated to Date: 2
Plan Prescribed Dose Per Fraction: 2.66 Gy
Plan Total Fractions Prescribed: 16
Plan Total Prescribed Dose: 42.56 Gy
Reference Point Dosage Given to Date: 5.32 Gy
Reference Point Session Dosage Given: 2.66 Gy
Session Number: 2

## 2024-02-16 MED ORDER — CLOBETASOL PROPIONATE 0.05 % EX CREA: 1.0000 | TOPICAL_CREAM | Freq: Two times a day (BID) | CUTANEOUS | 0 refills | Status: DC

## 2024-02-19 ENCOUNTER — Ambulatory Visit
Admission: RE | Admit: 2024-02-19 | Discharge: 2024-02-19 | Disposition: A | Discharge status: Home / Self Care | Source: Ambulatory Visit | Attending: Radiation Oncology | Admitting: Radiation Oncology

## 2024-02-19 ENCOUNTER — Other Ambulatory Visit: Payer: Self-pay

## 2024-02-19 DIAGNOSIS — Z510 Encounter for antineoplastic radiation therapy: Secondary | ICD-10-CM | POA: Diagnosis not present

## 2024-02-19 LAB — RAD ONC ARIA SESSION SUMMARY
Course Elapsed Days: 4
Plan Fractions Treated to Date: 3
Plan Prescribed Dose Per Fraction: 2.66 Gy
Plan Total Fractions Prescribed: 16
Plan Total Prescribed Dose: 42.56 Gy
Reference Point Dosage Given to Date: 7.98 Gy
Reference Point Session Dosage Given: 2.66 Gy
Session Number: 3

## 2024-02-20 ENCOUNTER — Other Ambulatory Visit: Payer: Self-pay

## 2024-02-20 ENCOUNTER — Ambulatory Visit
Admission: RE | Admit: 2024-02-20 | Discharge: 2024-02-20 | Disposition: A | Discharge status: Home / Self Care | Source: Ambulatory Visit | Attending: Radiation Oncology | Admitting: Radiation Oncology

## 2024-02-20 DIAGNOSIS — Z510 Encounter for antineoplastic radiation therapy: Secondary | ICD-10-CM | POA: Diagnosis not present

## 2024-02-20 LAB — RAD ONC ARIA SESSION SUMMARY
Course Elapsed Days: 5
Plan Fractions Treated to Date: 4
Plan Prescribed Dose Per Fraction: 2.66 Gy
Plan Total Fractions Prescribed: 16
Plan Total Prescribed Dose: 42.56 Gy
Reference Point Dosage Given to Date: 10.64 Gy
Reference Point Session Dosage Given: 2.66 Gy
Session Number: 4

## 2024-02-21 ENCOUNTER — Ambulatory Visit
Admission: RE | Admit: 2024-02-21 | Discharge: 2024-02-21 | Disposition: A | Discharge status: Home / Self Care | Source: Ambulatory Visit | Attending: Radiation Oncology | Admitting: Radiation Oncology

## 2024-02-21 ENCOUNTER — Other Ambulatory Visit: Payer: Self-pay

## 2024-02-21 ENCOUNTER — Inpatient Hospital Stay

## 2024-02-21 DIAGNOSIS — Z510 Encounter for antineoplastic radiation therapy: Secondary | ICD-10-CM | POA: Diagnosis not present

## 2024-02-21 DIAGNOSIS — D0512 Intraductal carcinoma in situ of left breast: Secondary | ICD-10-CM

## 2024-02-21 LAB — RAD ONC ARIA SESSION SUMMARY
Course Elapsed Days: 6
Plan Fractions Treated to Date: 5
Plan Prescribed Dose Per Fraction: 2.66 Gy
Plan Total Fractions Prescribed: 16
Plan Total Prescribed Dose: 42.56 Gy
Reference Point Dosage Given to Date: 13.3 Gy
Reference Point Session Dosage Given: 2.66 Gy
Session Number: 5

## 2024-02-21 LAB — CBC (CANCER CENTER ONLY)
HCT: 41.1 % (ref 36.0–46.0)
Hemoglobin: 13.9 g/dL (ref 12.0–15.0)
MCH: 29.4 pg (ref 26.0–34.0)
MCHC: 33.8 g/dL (ref 30.0–36.0)
MCV: 86.9 fL (ref 80.0–100.0)
Platelet Count: 268 K/uL (ref 150–400)
RBC: 4.73 MIL/uL (ref 3.87–5.11)
RDW: 12.1 % (ref 11.5–15.5)
WBC Count: 7.3 K/uL (ref 4.0–10.5)
nRBC: 0 % (ref 0.0–0.2)

## 2024-02-22 ENCOUNTER — Ambulatory Visit
Admission: RE | Admit: 2024-02-22 | Discharge: 2024-02-22 | Disposition: A | Discharge status: Home / Self Care | Source: Ambulatory Visit | Attending: Radiation Oncology | Admitting: Radiation Oncology

## 2024-02-22 ENCOUNTER — Other Ambulatory Visit: Payer: Self-pay

## 2024-02-22 DIAGNOSIS — Z510 Encounter for antineoplastic radiation therapy: Secondary | ICD-10-CM | POA: Diagnosis not present

## 2024-02-22 LAB — RAD ONC ARIA SESSION SUMMARY
Course Elapsed Days: 7
Plan Fractions Treated to Date: 6
Plan Prescribed Dose Per Fraction: 2.66 Gy
Plan Total Fractions Prescribed: 16
Plan Total Prescribed Dose: 42.56 Gy
Reference Point Dosage Given to Date: 15.96 Gy
Reference Point Session Dosage Given: 2.66 Gy
Session Number: 6

## 2024-02-23 ENCOUNTER — Other Ambulatory Visit: Payer: Self-pay

## 2024-02-23 ENCOUNTER — Ambulatory Visit
Admission: RE | Admit: 2024-02-23 | Discharge: 2024-02-23 | Disposition: A | Discharge status: Home / Self Care | Source: Ambulatory Visit | Attending: Radiation Oncology | Admitting: Radiation Oncology

## 2024-02-23 DIAGNOSIS — Z510 Encounter for antineoplastic radiation therapy: Secondary | ICD-10-CM | POA: Insufficient documentation

## 2024-02-23 DIAGNOSIS — Z170 Estrogen receptor positive status [ER+]: Secondary | ICD-10-CM | POA: Insufficient documentation

## 2024-02-23 DIAGNOSIS — D0512 Intraductal carcinoma in situ of left breast: Secondary | ICD-10-CM | POA: Diagnosis not present

## 2024-02-23 LAB — RAD ONC ARIA SESSION SUMMARY
Course Elapsed Days: 8
Plan Fractions Treated to Date: 7
Plan Prescribed Dose Per Fraction: 2.66 Gy
Plan Total Fractions Prescribed: 16
Plan Total Prescribed Dose: 42.56 Gy
Reference Point Dosage Given to Date: 18.62 Gy
Reference Point Session Dosage Given: 2.66 Gy
Session Number: 7

## 2024-02-26 ENCOUNTER — Ambulatory Visit
Admission: RE | Admit: 2024-02-26 | Discharge: 2024-02-26 | Disposition: A | Discharge status: Home / Self Care | Source: Ambulatory Visit | Attending: Radiation Oncology | Admitting: Radiation Oncology

## 2024-02-26 ENCOUNTER — Other Ambulatory Visit: Payer: Self-pay

## 2024-02-26 DIAGNOSIS — Z510 Encounter for antineoplastic radiation therapy: Secondary | ICD-10-CM | POA: Diagnosis not present

## 2024-02-26 LAB — RAD ONC ARIA SESSION SUMMARY
Course Elapsed Days: 11
Plan Fractions Treated to Date: 8
Plan Prescribed Dose Per Fraction: 2.66 Gy
Plan Total Fractions Prescribed: 16
Plan Total Prescribed Dose: 42.56 Gy
Reference Point Dosage Given to Date: 21.28 Gy
Reference Point Session Dosage Given: 2.66 Gy
Session Number: 8

## 2024-02-27 ENCOUNTER — Ambulatory Visit
Admission: RE | Admit: 2024-02-27 | Discharge: 2024-02-27 | Disposition: A | Discharge status: Home / Self Care | Source: Ambulatory Visit | Attending: Radiation Oncology | Admitting: Radiation Oncology

## 2024-02-27 ENCOUNTER — Other Ambulatory Visit: Payer: Self-pay

## 2024-02-27 DIAGNOSIS — Z510 Encounter for antineoplastic radiation therapy: Secondary | ICD-10-CM | POA: Diagnosis not present

## 2024-02-27 LAB — RAD ONC ARIA SESSION SUMMARY
Course Elapsed Days: 12
Plan Fractions Treated to Date: 9
Plan Prescribed Dose Per Fraction: 2.66 Gy
Plan Total Fractions Prescribed: 16
Plan Total Prescribed Dose: 42.56 Gy
Reference Point Dosage Given to Date: 23.94 Gy
Reference Point Session Dosage Given: 2.66 Gy
Session Number: 9

## 2024-02-28 ENCOUNTER — Ambulatory Visit
Admission: RE | Admit: 2024-02-28 | Discharge: 2024-02-28 | Disposition: A | Discharge status: Home / Self Care | Source: Ambulatory Visit | Attending: Radiation Oncology | Admitting: Radiation Oncology

## 2024-02-28 ENCOUNTER — Other Ambulatory Visit: Payer: Self-pay

## 2024-02-28 DIAGNOSIS — Z510 Encounter for antineoplastic radiation therapy: Secondary | ICD-10-CM | POA: Diagnosis not present

## 2024-02-28 LAB — RAD ONC ARIA SESSION SUMMARY
Course Elapsed Days: 13
Plan Fractions Treated to Date: 10
Plan Prescribed Dose Per Fraction: 2.66 Gy
Plan Total Fractions Prescribed: 16
Plan Total Prescribed Dose: 42.56 Gy
Reference Point Dosage Given to Date: 26.6 Gy
Reference Point Session Dosage Given: 2.66 Gy
Session Number: 10

## 2024-02-29 ENCOUNTER — Other Ambulatory Visit: Payer: Self-pay

## 2024-02-29 ENCOUNTER — Ambulatory Visit
Admission: RE | Admit: 2024-02-29 | Discharge: 2024-02-29 | Disposition: A | Discharge status: Home / Self Care | Source: Ambulatory Visit | Attending: Radiation Oncology | Admitting: Radiation Oncology

## 2024-02-29 DIAGNOSIS — Z510 Encounter for antineoplastic radiation therapy: Secondary | ICD-10-CM | POA: Diagnosis not present

## 2024-02-29 LAB — RAD ONC ARIA SESSION SUMMARY
Course Elapsed Days: 14
Plan Fractions Treated to Date: 11
Plan Prescribed Dose Per Fraction: 2.66 Gy
Plan Total Fractions Prescribed: 16
Plan Total Prescribed Dose: 42.56 Gy
Reference Point Dosage Given to Date: 29.26 Gy
Reference Point Session Dosage Given: 2.66 Gy
Session Number: 11

## 2024-03-01 ENCOUNTER — Ambulatory Visit
Admission: RE | Admit: 2024-03-01 | Discharge: 2024-03-01 | Disposition: A | Discharge status: Home / Self Care | Source: Ambulatory Visit | Attending: Radiation Oncology | Admitting: Radiation Oncology

## 2024-03-01 ENCOUNTER — Other Ambulatory Visit: Payer: Self-pay

## 2024-03-01 DIAGNOSIS — Z510 Encounter for antineoplastic radiation therapy: Secondary | ICD-10-CM | POA: Diagnosis not present

## 2024-03-01 LAB — RAD ONC ARIA SESSION SUMMARY
Course Elapsed Days: 15
Plan Fractions Treated to Date: 12
Plan Prescribed Dose Per Fraction: 2.66 Gy
Plan Total Fractions Prescribed: 16
Plan Total Prescribed Dose: 42.56 Gy
Reference Point Dosage Given to Date: 31.92 Gy
Reference Point Session Dosage Given: 2.66 Gy
Session Number: 12

## 2024-03-04 ENCOUNTER — Ambulatory Visit
Admission: RE | Admit: 2024-03-04 | Discharge: 2024-03-04 | Disposition: A | Discharge status: Home / Self Care | Source: Ambulatory Visit | Attending: Radiation Oncology | Admitting: Radiation Oncology

## 2024-03-04 ENCOUNTER — Other Ambulatory Visit: Payer: Self-pay

## 2024-03-04 DIAGNOSIS — Z510 Encounter for antineoplastic radiation therapy: Secondary | ICD-10-CM | POA: Diagnosis not present

## 2024-03-04 LAB — RAD ONC ARIA SESSION SUMMARY
Course Elapsed Days: 18
Plan Fractions Treated to Date: 13
Plan Prescribed Dose Per Fraction: 2.66 Gy
Plan Total Fractions Prescribed: 16
Plan Total Prescribed Dose: 42.56 Gy
Reference Point Dosage Given to Date: 34.58 Gy
Reference Point Session Dosage Given: 2.66 Gy
Session Number: 13

## 2024-03-05 ENCOUNTER — Other Ambulatory Visit: Payer: Self-pay

## 2024-03-05 ENCOUNTER — Ambulatory Visit
Admission: RE | Admit: 2024-03-05 | Discharge: 2024-03-05 | Disposition: A | Discharge status: Home / Self Care | Source: Ambulatory Visit | Attending: Radiation Oncology | Admitting: Radiation Oncology

## 2024-03-05 DIAGNOSIS — Z510 Encounter for antineoplastic radiation therapy: Secondary | ICD-10-CM | POA: Diagnosis not present

## 2024-03-05 LAB — RAD ONC ARIA SESSION SUMMARY
Course Elapsed Days: 19
Plan Fractions Treated to Date: 14
Plan Prescribed Dose Per Fraction: 2.66 Gy
Plan Total Fractions Prescribed: 16
Plan Total Prescribed Dose: 42.56 Gy
Reference Point Dosage Given to Date: 37.24 Gy
Reference Point Session Dosage Given: 2.66 Gy
Session Number: 14

## 2024-03-06 ENCOUNTER — Other Ambulatory Visit: Payer: Self-pay

## 2024-03-06 ENCOUNTER — Ambulatory Visit
Admission: RE | Admit: 2024-03-06 | Discharge: 2024-03-06 | Disposition: A | Discharge status: Home / Self Care | Source: Ambulatory Visit | Attending: Radiation Oncology | Admitting: Radiation Oncology

## 2024-03-06 ENCOUNTER — Ambulatory Visit

## 2024-03-06 ENCOUNTER — Inpatient Hospital Stay

## 2024-03-06 DIAGNOSIS — D0512 Intraductal carcinoma in situ of left breast: Secondary | ICD-10-CM | POA: Insufficient documentation

## 2024-03-06 DIAGNOSIS — Z510 Encounter for antineoplastic radiation therapy: Secondary | ICD-10-CM | POA: Diagnosis not present

## 2024-03-06 DIAGNOSIS — Z79811 Long term (current) use of aromatase inhibitors: Secondary | ICD-10-CM | POA: Insufficient documentation

## 2024-03-06 LAB — RAD ONC ARIA SESSION SUMMARY
Course Elapsed Days: 20
Plan Fractions Treated to Date: 15
Plan Prescribed Dose Per Fraction: 2.66 Gy
Plan Total Fractions Prescribed: 16
Plan Total Prescribed Dose: 42.56 Gy
Reference Point Dosage Given to Date: 39.9 Gy
Reference Point Session Dosage Given: 2.66 Gy
Session Number: 15

## 2024-03-06 LAB — CBC (CANCER CENTER ONLY)
HCT: 39.2 % (ref 36.0–46.0)
Hemoglobin: 13.2 g/dL (ref 12.0–15.0)
MCH: 29.5 pg (ref 26.0–34.0)
MCHC: 33.7 g/dL (ref 30.0–36.0)
MCV: 87.5 fL (ref 80.0–100.0)
Platelet Count: 242 K/uL (ref 150–400)
RBC: 4.48 MIL/uL (ref 3.87–5.11)
RDW: 12.2 % (ref 11.5–15.5)
WBC Count: 6 K/uL (ref 4.0–10.5)
nRBC: 0 % (ref 0.0–0.2)

## 2024-03-07 ENCOUNTER — Ambulatory Visit
Admission: RE | Admit: 2024-03-07 | Discharge: 2024-03-07 | Disposition: A | Discharge status: Home / Self Care | Source: Ambulatory Visit | Attending: Radiation Oncology | Admitting: Radiation Oncology

## 2024-03-07 ENCOUNTER — Ambulatory Visit

## 2024-03-07 ENCOUNTER — Other Ambulatory Visit: Payer: Self-pay

## 2024-03-07 DIAGNOSIS — Z510 Encounter for antineoplastic radiation therapy: Secondary | ICD-10-CM | POA: Diagnosis not present

## 2024-03-07 LAB — RAD ONC ARIA SESSION SUMMARY
Course Elapsed Days: 21
Plan Fractions Treated to Date: 16
Plan Prescribed Dose Per Fraction: 2.66 Gy
Plan Total Fractions Prescribed: 16
Plan Total Prescribed Dose: 42.56 Gy
Reference Point Dosage Given to Date: 42.56 Gy
Reference Point Session Dosage Given: 2.66 Gy
Session Number: 16

## 2024-03-08 ENCOUNTER — Other Ambulatory Visit: Payer: Self-pay

## 2024-03-08 ENCOUNTER — Ambulatory Visit

## 2024-03-08 ENCOUNTER — Ambulatory Visit
Admission: RE | Admit: 2024-03-08 | Discharge: 2024-03-08 | Disposition: A | Discharge status: Home / Self Care | Source: Ambulatory Visit | Attending: Radiation Oncology | Admitting: Radiation Oncology

## 2024-03-08 DIAGNOSIS — Z510 Encounter for antineoplastic radiation therapy: Secondary | ICD-10-CM | POA: Diagnosis not present

## 2024-03-08 LAB — RAD ONC ARIA SESSION SUMMARY
Course Elapsed Days: 22
Plan Fractions Treated to Date: 1
Plan Prescribed Dose Per Fraction: 2 Gy
Plan Total Fractions Prescribed: 8
Plan Total Prescribed Dose: 16 Gy
Reference Point Dosage Given to Date: 2 Gy
Reference Point Session Dosage Given: 2 Gy
Session Number: 17

## 2024-03-11 ENCOUNTER — Ambulatory Visit
Admission: RE | Admit: 2024-03-11 | Discharge: 2024-03-11 | Discharge status: Home / Self Care | Source: Ambulatory Visit | Attending: Radiation Oncology | Admitting: Radiation Oncology

## 2024-03-11 ENCOUNTER — Other Ambulatory Visit: Payer: Self-pay

## 2024-03-11 DIAGNOSIS — Z510 Encounter for antineoplastic radiation therapy: Secondary | ICD-10-CM | POA: Diagnosis not present

## 2024-03-11 LAB — RAD ONC ARIA SESSION SUMMARY
Course Elapsed Days: 25
Plan Fractions Treated to Date: 2
Plan Prescribed Dose Per Fraction: 2 Gy
Plan Total Fractions Prescribed: 8
Plan Total Prescribed Dose: 16 Gy
Reference Point Dosage Given to Date: 4 Gy
Reference Point Session Dosage Given: 2 Gy
Session Number: 18

## 2024-03-12 ENCOUNTER — Ambulatory Visit
Admission: RE | Admit: 2024-03-12 | Discharge: 2024-03-12 | Discharge status: Home / Self Care | Source: Ambulatory Visit | Attending: Radiation Oncology | Admitting: Radiation Oncology

## 2024-03-12 ENCOUNTER — Other Ambulatory Visit: Payer: Self-pay

## 2024-03-12 DIAGNOSIS — Z510 Encounter for antineoplastic radiation therapy: Secondary | ICD-10-CM | POA: Diagnosis not present

## 2024-03-12 LAB — RAD ONC ARIA SESSION SUMMARY
Course Elapsed Days: 26
Plan Fractions Treated to Date: 3
Plan Prescribed Dose Per Fraction: 2 Gy
Plan Total Fractions Prescribed: 8
Plan Total Prescribed Dose: 16 Gy
Reference Point Dosage Given to Date: 6 Gy
Reference Point Session Dosage Given: 2 Gy
Session Number: 19

## 2024-03-13 ENCOUNTER — Other Ambulatory Visit: Payer: Self-pay | Admitting: *Deleted

## 2024-03-13 ENCOUNTER — Ambulatory Visit
Admission: RE | Admit: 2024-03-13 | Discharge: 2024-03-13 | Disposition: A | Discharge status: Home / Self Care | Source: Ambulatory Visit | Attending: Radiation Oncology | Admitting: Radiation Oncology

## 2024-03-13 ENCOUNTER — Other Ambulatory Visit: Payer: Self-pay

## 2024-03-13 DIAGNOSIS — Z510 Encounter for antineoplastic radiation therapy: Secondary | ICD-10-CM | POA: Diagnosis not present

## 2024-03-13 DIAGNOSIS — D0512 Intraductal carcinoma in situ of left breast: Secondary | ICD-10-CM

## 2024-03-13 LAB — RAD ONC ARIA SESSION SUMMARY
Course Elapsed Days: 27
Plan Fractions Treated to Date: 4
Plan Prescribed Dose Per Fraction: 2 Gy
Plan Total Fractions Prescribed: 8
Plan Total Prescribed Dose: 16 Gy
Reference Point Dosage Given to Date: 8 Gy
Reference Point Session Dosage Given: 2 Gy
Session Number: 20

## 2024-03-14 ENCOUNTER — Other Ambulatory Visit: Payer: Self-pay

## 2024-03-14 ENCOUNTER — Ambulatory Visit
Admission: RE | Admit: 2024-03-14 | Discharge: 2024-03-14 | Disposition: A | Discharge status: Home / Self Care | Source: Ambulatory Visit | Attending: Radiation Oncology | Admitting: Radiation Oncology

## 2024-03-14 ENCOUNTER — Inpatient Hospital Stay

## 2024-03-14 ENCOUNTER — Ambulatory Visit

## 2024-03-14 DIAGNOSIS — D0512 Intraductal carcinoma in situ of left breast: Secondary | ICD-10-CM

## 2024-03-14 DIAGNOSIS — Z510 Encounter for antineoplastic radiation therapy: Secondary | ICD-10-CM | POA: Diagnosis not present

## 2024-03-14 LAB — CBC (CANCER CENTER ONLY)
HCT: 39.8 % (ref 36.0–46.0)
Hemoglobin: 13.3 g/dL (ref 12.0–15.0)
MCH: 29.2 pg (ref 26.0–34.0)
MCHC: 33.4 g/dL (ref 30.0–36.0)
MCV: 87.5 fL (ref 80.0–100.0)
Platelet Count: 245 K/uL (ref 150–400)
RBC: 4.55 MIL/uL (ref 3.87–5.11)
RDW: 12.4 % (ref 11.5–15.5)
WBC Count: 6.4 K/uL (ref 4.0–10.5)
nRBC: 0 % (ref 0.0–0.2)

## 2024-03-14 LAB — RAD ONC ARIA SESSION SUMMARY
Course Elapsed Days: 28
Plan Fractions Treated to Date: 5
Plan Prescribed Dose Per Fraction: 2 Gy
Plan Total Fractions Prescribed: 8
Plan Total Prescribed Dose: 16 Gy
Reference Point Dosage Given to Date: 10 Gy
Reference Point Session Dosage Given: 2 Gy
Session Number: 21

## 2024-03-15 ENCOUNTER — Ambulatory Visit
Admission: RE | Admit: 2024-03-15 | Discharge: 2024-03-15 | Disposition: A | Discharge status: Home / Self Care | Source: Ambulatory Visit | Attending: Radiation Oncology | Admitting: Radiation Oncology

## 2024-03-15 ENCOUNTER — Inpatient Hospital Stay

## 2024-03-15 ENCOUNTER — Ambulatory Visit

## 2024-03-15 ENCOUNTER — Other Ambulatory Visit: Payer: Self-pay

## 2024-03-15 DIAGNOSIS — Z510 Encounter for antineoplastic radiation therapy: Secondary | ICD-10-CM | POA: Diagnosis not present

## 2024-03-15 LAB — RAD ONC ARIA SESSION SUMMARY
Course Elapsed Days: 29
Plan Fractions Treated to Date: 6
Plan Prescribed Dose Per Fraction: 2 Gy
Plan Total Fractions Prescribed: 8
Plan Total Prescribed Dose: 16 Gy
Reference Point Dosage Given to Date: 12 Gy
Reference Point Session Dosage Given: 2 Gy
Session Number: 22

## 2024-03-15 NOTE — Progress Notes (Signed)
 [facility] CSW Progress Note  Clinical Child psychotherapist received call from patient regarding massages.    Interventions: Provided patient with information about the process of obtaining the free massage certificates.  Patient to obtain release from Dr. Chrystal on her last treatment.  CSW to meet with patient on 8/26 to provide certificates.  Patient stated she understood.       Follow Up Plan:  CSW will see patient on 8/26.    Darlene [name] [name], LCSW Clinical Social Worker [facility]

## 2024-03-18 ENCOUNTER — Other Ambulatory Visit: Payer: Self-pay

## 2024-03-18 ENCOUNTER — Ambulatory Visit

## 2024-03-18 ENCOUNTER — Ambulatory Visit
Admission: RE | Admit: 2024-03-18 | Discharge: 2024-03-18 | Disposition: A | Discharge status: Home / Self Care | Source: Ambulatory Visit | Attending: Radiation Oncology | Admitting: Radiation Oncology

## 2024-03-18 DIAGNOSIS — Z510 Encounter for antineoplastic radiation therapy: Secondary | ICD-10-CM | POA: Diagnosis not present

## 2024-03-18 LAB — RAD ONC ARIA SESSION SUMMARY
Course Elapsed Days: 32
Plan Fractions Treated to Date: 7
Plan Prescribed Dose Per Fraction: 2 Gy
Plan Total Fractions Prescribed: 8
Plan Total Prescribed Dose: 16 Gy
Reference Point Dosage Given to Date: 14 Gy
Reference Point Session Dosage Given: 2 Gy
Session Number: 23

## 2024-03-19 ENCOUNTER — Other Ambulatory Visit: Payer: Self-pay

## 2024-03-19 ENCOUNTER — Telehealth: Payer: Self-pay

## 2024-03-19 ENCOUNTER — Inpatient Hospital Stay

## 2024-03-19 ENCOUNTER — Ambulatory Visit
Admission: RE | Admit: 2024-03-19 | Discharge: 2024-03-19 | Disposition: A | Discharge status: Home / Self Care | Source: Ambulatory Visit | Attending: Radiation Oncology | Admitting: Radiation Oncology

## 2024-03-19 ENCOUNTER — Encounter: Payer: Self-pay | Admitting: *Deleted

## 2024-03-19 DIAGNOSIS — Z510 Encounter for antineoplastic radiation therapy: Secondary | ICD-10-CM | POA: Diagnosis not present

## 2024-03-19 LAB — RAD ONC ARIA SESSION SUMMARY
Course Elapsed Days: 33
Plan Fractions Treated to Date: 8
Plan Prescribed Dose Per Fraction: 2 Gy
Plan Total Fractions Prescribed: 8
Plan Total Prescribed Dose: 16 Gy
Reference Point Dosage Given to Date: 16 Gy
Reference Point Session Dosage Given: 2 Gy
Session Number: 24

## 2024-03-19 NOTE — Telephone Encounter (Signed)
 Clinical Social Work was referred by medical provider for assessment of psychosocial needs.  CSW attempted to contact patient by phone.  Left voicemail with contact information and request for return call.

## 2024-03-21 NOTE — Radiation Completion Notes (Signed)
 Patient Name: Darlene [name], Darlene [name] MRN: [id number] Date of Birth: 1975/11/20 Referring Physician: ANNAH SKENE, M.D. Date of Service: 2024-03-21 Radiation Oncologist: Marcey Penton, M.D. 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  Diagnostic mammogram showed 2 groups of indeterminate calcifications of the left breast measuring 16 and 5 mm respectively for estimated span of 2.8 cm.  She underwent stereotactic guided biopsy which was positive for ER positive ductal carcinoma in situ.  Breast MRI scan demonstrated indeterminate 1.4 cm non-mass enhancement along the outer left breast at posterior depth.  No MRI evidence of malignancy involving the right breast.  Patient underwent wide local excision of both areas.  DCIS spanned 3.5 x 1.4 x 0.8 cm.  Tumor was intermediate nuclear grade.  Tumor was strongly ER positive.  Secondary is also removed through wide local excision again showing 1.7 x 0.9 x 0.8 cm intermediate grade ductal carcinoma in situ.  On the first lumpectomy DCIS present at the anterior margin on the second specimen DCIS was present at the lateral margin.  She underwent further margin excision in the retroareolar anterior margin which showed again show 0.25 mm new anterior margin.  No invasive carcinoma was seen.  Lateral margin also was reexcised again showing DCIS 0.7 mm from the new lateral margin.  There was also upper outer quadrant inferior margin excised showing no evidence of malignancy.  She has done well postoperatively.  Has been seen by medical oncology with recommendation for endocrine  therapy after completion of radiation.  She is seeing Dr. Ebbie in follow-up next week.  She specifically denies significant breast tenderness cough or bone pain.      ==========DELIVERED PLANS==========  First Treatment Date: 2024-02-15 Last Treatment Date: 2024-03-19   Plan Name: Breast_L_BH Site: Breast, Left Technique: 3D Mode: Photon Dose Per Fraction: 2.66 Gy Prescribed Dose (Delivered / Prescribed): 42.56 Gy / 42.56 Gy Prescribed Fxs (Delivered / Prescribed): 16 / 16   Plan Name: Breast_L_Bst Site: Breast, Left Technique: 3D Mode: Photon Dose Per Fraction: 2 Gy Prescribed Dose (Delivered / Prescribed): 16 Gy / 16 Gy Prescribed Fxs (Delivered / Prescribed): 8 / 8     ==========ON TREATMENT VISIT DATES========== 2024-02-20, 2024-02-27, 2024-03-05, 2024-03-12, 2024-03-19     ==========UPCOMING VISITS========== 04/17/2024 [facility] RAD ONCOLOGY FOLLOW UP 30 Penton Marcey, MD  04/16/2024 [facility]  04/15/2024 [facility] MED ONC EST PT SKENE ANNAH BROCKS, MD  04/15/2024 [facility]        ==========APPENDIX - ON TREATMENT VISIT NOTES==========   See weekly On Treatment Notes in Epic for details in the Media tab (listed as Progress notes on the On Treatment Visit Dates listed above).

## 2024-03-29 ENCOUNTER — Encounter: Payer: Self-pay | Admitting: Nurse Practitioner

## 2024-03-29 DIAGNOSIS — I10 Essential (primary) hypertension: Secondary | ICD-10-CM

## 2024-03-29 DIAGNOSIS — E669 Obesity, unspecified: Secondary | ICD-10-CM

## 2024-04-01 ENCOUNTER — Encounter: Payer: Self-pay | Admitting: Oncology

## 2024-04-01 MED ORDER — COVID-19 MRNA VAC-TRIS(PFIZER) 30 MCG/0.3ML IM SUSY: 0.3000 mL | PREFILLED_SYRINGE | Freq: Once | INTRAMUSCULAR | 0 refills | Status: AC

## 2024-04-15 ENCOUNTER — Ambulatory Visit: Admitting: Oncology

## 2024-04-15 ENCOUNTER — Ambulatory Visit: Admitting: Radiation Oncology

## 2024-04-15 ENCOUNTER — Other Ambulatory Visit

## 2024-04-16 ENCOUNTER — Other Ambulatory Visit

## 2024-04-17 ENCOUNTER — Ambulatory Visit: Admitting: Radiation Oncology

## 2024-04-18 ENCOUNTER — Encounter: Payer: Self-pay | Admitting: Oncology

## 2024-04-18 ENCOUNTER — Telehealth: Payer: Self-pay | Admitting: *Deleted

## 2024-04-18 NOTE — Telephone Encounter (Signed)
 The patient says she has been on tamoxifen  and she is feeling pain in her ovaries and wondered if that is a problem from being on the tamoxifen .  Or something else making her pain and ovaries

## 2024-04-19 NOTE — Telephone Encounter (Signed)
 Per Dr. Melanee Tamoxifen  does not cause ovarian pain. She can speak to her pcp about it, if she concerned it is tamoxifen - she can stop it for 2 weeks and see how she feels.  Outbound call to patient; informed of above.  Patient said she googled that tamoxifen  can cause ovarian cysts.  Patient also indicated she has an appt at [facility] with her ob/gyn; informed she can share her concerns at the time of the appointment.  Patient indicated for now she will continue to take Tamoxifen .

## 2024-04-19 NOTE — Telephone Encounter (Signed)
 Tamoxifen  does not cause ovarian pain. She can speak to her pcp about it, if she concerned it is tamoxifen - she can stop it for 2 weeks and see how she feels

## 2024-04-19 NOTE — Telephone Encounter (Signed)
 Please see the response that I sent earlier

## 2024-04-25 ENCOUNTER — Other Ambulatory Visit: Payer: Self-pay | Admitting: Nurse Practitioner

## 2024-04-26 NOTE — Telephone Encounter (Signed)
 Requested medications are due for refill today.  yes  Requested medications are on the active medications list.  yes  Last refill. 10/31/2023 #40 0 rf  Future visit scheduled.   yes  Notes to clinic.  Refill not delegated.   Requested Prescriptions  Pending Prescriptions Disp Refills   traMADol  (ULTRAM ) 50 MG tablet [Pharmacy Med Name: TRAMADOL  HCL 50 MG TABLET] 40 tablet 0    Sig: TAKE 2 TABLETS BY MOUTH EVERY 6 HOURS AS NEEDED     Not Delegated - Analgesics:  Opioid Agonists Failed - 04/26/2024  3:17 PM      Failed - This refill cannot be delegated      Failed - Urine Drug Screen completed in last 360 days      Failed - Valid encounter within last 3 months    Recent Outpatient Visits           4 months ago Annual physical exam   [place] [facility] Melvin Pao, NP   7 months ago Throat congestion   [place] [facility] Melvin Pao, NP

## 2024-04-29 ENCOUNTER — Ambulatory Visit: Admitting: Oncology

## 2024-04-29 ENCOUNTER — Other Ambulatory Visit

## 2024-04-30 ENCOUNTER — Encounter: Payer: Self-pay | Admitting: Nurse Practitioner

## 2024-04-30 ENCOUNTER — Other Ambulatory Visit: Payer: Self-pay | Admitting: Nurse Practitioner

## 2024-04-30 MED ORDER — TRAMADOL HCL 50 MG PO TABS: 100.0000 mg | ORAL_TABLET | Freq: Four times a day (QID) | ORAL | 0 refills | Status: AC | PRN

## 2024-04-30 MED ORDER — TRAMADOL HCL 50 MG PO TABS: 100.0000 mg | ORAL_TABLET | Freq: Four times a day (QID) | ORAL | 0 refills | Status: DC | PRN

## 2024-05-02 NOTE — Telephone Encounter (Signed)
 Requested medications are due for refill today.  no  Requested medications are on the active medications list.  yes  Last refill. 04/30/2024 #40 0 rf  Future visit scheduled.   yes  Notes to clinic.  Refill/refusal not delegated.    Requested Prescriptions  Pending Prescriptions Disp Refills   traMADol  (ULTRAM ) 50 MG tablet [Pharmacy Med Name: TRAMADOL  HCL 50 MG TABLET] 40 tablet     Sig: TAKE 2 TABLETS BY MOUTH EVERY 6 HOURS AS NEEDED     Not Delegated - Analgesics:  Opioid Agonists Failed - 05/02/2024 11:03 AM      Failed - This refill cannot be delegated      Failed - Urine Drug Screen completed in last 360 days      Failed - Valid encounter within last 3 months    Recent Outpatient Visits           4 months ago Annual physical exam   [place] [facility] Melvin Pao, NP   7 months ago Throat congestion   [place] [facility] Melvin Pao, NP

## 2024-05-08 ENCOUNTER — Encounter: Payer: Self-pay | Admitting: Nurse Practitioner

## 2024-05-23 ENCOUNTER — Encounter: Payer: Self-pay | Admitting: Radiation Oncology

## 2024-05-23 ENCOUNTER — Ambulatory Visit
Admission: RE | Admit: 2024-05-23 | Discharge: 2024-05-23 | Disposition: A | Discharge status: Home / Self Care | Source: Ambulatory Visit | Attending: Radiation Oncology | Admitting: Radiation Oncology

## 2024-05-23 VITALS — BP 128/91 | HR 78 | Temp 98.0°F | Resp 15 | Ht 67.0 in

## 2024-05-23 DIAGNOSIS — Z170 Estrogen receptor positive status [ER+]: Secondary | ICD-10-CM | POA: Insufficient documentation

## 2024-05-23 DIAGNOSIS — Z923 Personal history of irradiation: Secondary | ICD-10-CM | POA: Diagnosis not present

## 2024-05-23 DIAGNOSIS — Z79810 Long term (current) use of selective estrogen receptor modulators (SERMs): Secondary | ICD-10-CM | POA: Insufficient documentation

## 2024-05-23 DIAGNOSIS — D0512 Intraductal carcinoma in situ of left breast: Secondary | ICD-10-CM | POA: Diagnosis present

## 2024-05-23 NOTE — Progress Notes (Signed)
 Radiation Oncology Follow up Note  Name: Darlene [name]   Date:   05/23/2024 MRN:  [id number] DOB: 08/26/1975    This 48 y.o. 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  REFERRING PROVIDER: Melvin Pao, NP  HPI: Patient is a 48 year old female now out 2 months for insurance reasons status post radiation therapy to her left breast for ER-positive ductal carcinoma in situ.  Since seen today in routine follow-up she is doing well.  She specifically denies breast tenderness cough or bone pain..  She is currently on tamoxifen  tolerating that well without side effect.  COMPLICATIONS OF TREATMENT: none  FOLLOW UP COMPLIANCE: keeps appointments   PHYSICAL EXAM:  BP (!) 128/91   Pulse 78   Temp 98 F (36.7 C) (Tympanic)   Resp 15   Ht 5' 7 (1.702 m)   BMI 36.63 kg/m  Lungs are clear to A&P cardiac examination essentially unremarkable with regular rate and rhythm. No dominant mass or nodularity is noted in either breast in 2 positions examined. Incision is well-healed. No axillary or supraclavicular adenopathy is appreciated. Cosmetic result is excellent.  Well-developed well-nourished patient in NAD. HEENT reveals PERLA, EOMI, discs not visualized.  Oral cavity is clear. No oral mucosal lesions are identified. Neck is clear without evidence of cervical or supraclavicular adenopathy. Lungs are clear to A&P. Cardiac examination is essentially unremarkable with regular rate and rhythm without murmur rub or thrill. Abdomen is benign with no organomegaly or masses noted. Motor sensory and DTR levels are equal and symmetric in the upper and lower extremities. Cranial nerves II through XII are grossly intact. Proprioception is intact. No peripheral adenopathy or edema is identified. No motor or sensory levels are noted. Crude visual fields are within normal  range.  RADIOLOGY RESULTS: No current films for review  PLAN: At the present time patient is doing well very low side effect profile from whole breast radiation.  I am pleased with her overall progress.  She continues on tamoxifen  without side effect.  I am going to turn follow-up care over to medical oncology.  I be happy to reevaluate the patient anytime should that be indicated.  Patient knows to call with any concerns.  I would like to take this opportunity to thank you for allowing me to participate in the care of your patient.SABRA Marcey Penton, MD

## 2024-05-30 ENCOUNTER — Ambulatory Visit
Admission: RE | Admit: 2024-05-30 | Discharge: 2024-05-30 | Disposition: A | Discharge status: Home / Self Care | Source: Ambulatory Visit | Attending: Oncology | Admitting: Oncology

## 2024-05-30 DIAGNOSIS — D0512 Intraductal carcinoma in situ of left breast: Secondary | ICD-10-CM | POA: Diagnosis present

## 2024-06-07 ENCOUNTER — Inpatient Hospital Stay: Payer: Self-pay | Attending: Radiation Oncology

## 2024-06-07 ENCOUNTER — Inpatient Hospital Stay: Payer: Self-pay | Admitting: Oncology

## 2024-06-07 ENCOUNTER — Encounter: Payer: Self-pay | Admitting: Oncology

## 2024-06-07 ENCOUNTER — Telehealth: Payer: Self-pay

## 2024-06-07 ENCOUNTER — Other Ambulatory Visit: Payer: Self-pay

## 2024-06-07 VITALS — BP 127/75 | HR 79 | Temp 97.8°F | Resp 19 | Ht 67.0 in | Wt 233.9 lb

## 2024-06-07 DIAGNOSIS — Z79810 Long term (current) use of selective estrogen receptor modulators (SERMs): Secondary | ICD-10-CM

## 2024-06-07 DIAGNOSIS — D0512 Intraductal carcinoma in situ of left breast: Secondary | ICD-10-CM

## 2024-06-07 DIAGNOSIS — Z5181 Encounter for therapeutic drug level monitoring: Secondary | ICD-10-CM

## 2024-06-07 LAB — CMP (CANCER CENTER ONLY)
ALT: 13 U/L (ref 0–44)
AST: 17 U/L (ref 15–41)
Albumin: 3.5 g/dL (ref 3.5–5.0)
Alkaline Phosphatase: 13 U/L — ABNORMAL LOW (ref 38–126)
Anion gap: 7 (ref 5–15)
BUN: 18 mg/dL (ref 6–20)
CO2: 26 mmol/L (ref 22–32)
Calcium: 8.7 mg/dL — ABNORMAL LOW (ref 8.9–10.3)
Chloride: 106 mmol/L (ref 98–111)
Creatinine: 0.79 mg/dL (ref 0.44–1.00)
GFR, Estimated: >60 mL/min (ref >60)
Glucose, Bld: 101 mg/dL — ABNORMAL HIGH (ref 70–99)
Potassium: 3.8 mmol/L (ref 3.5–5.1)
Sodium: 139 mmol/L (ref 135–145)
Total Bilirubin: 0.3 mg/dL (ref 0.0–1.2)
Total Protein: 6.9 g/dL (ref 6.5–8.1)

## 2024-06-07 MED ORDER — TAMOXIFEN CITRATE 20 MG PO TABS: 20.0000 mg | ORAL_TABLET | Freq: Every day | ORAL | 1 refills | Status: AC

## 2024-06-07 NOTE — Progress Notes (Signed)
 Patient has no new or acute concerns at this time.

## 2024-06-07 NOTE — Telephone Encounter (Signed)
 Per OV wrap up dated today 06/07/24 needs transvaginal pelvic ultrasound 5 months at [facility].  Per Tawni no authorization is required.  Outbound call to [facility] scheduling, was transferred to OB/GYN spoke to Angie who provided fax number [phone]; direct contact number for Angie if there's any questions is [phone].  Angie mentioned patient recently saw [facility] in October and had ultrasound done 04/25/24.  Stated did not need recent OV notes, can pull up in Epic.  Fax sent, confirmation received.

## 2024-06-07 NOTE — Progress Notes (Signed)
 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  This was followed by diagnostic mammogram which showed 2 groups of indeterminate calcifications in the left breast 16 mm and 5 mm respectively for an estimated span of 28 mm.  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    Patient underwent lumpectomy on 01/11/2024.  Final pathology showed 3.6 x 1.4 x 0.8 cm DCIS.  Final anterior margin was close at 0.25 mm and the rest of margins were negative.  ER +90%.  Focal areas of comedonecrosis.  Genetic testing negative.  Patient had Nexplanon prior to her diagnosis of DCIS which was subsequently removed.  She has undergone uterine ablation in the past and therefore does not get menstrual cycles.  Ovaries are still in situ hormone levels were positive.  Patient was therefore started on tamoxifen  in August 2025  Interval history- Discussed the use of AI scribe software for clinical note transcription with the patient, who gave verbal consent to proceed.  Darlene [name] is a 48 year old female with ductal carcinoma in situ (DCIS) who presents for follow-up after radiation treatment and discussion of endocrine therapy options.  She has a history of ductal  carcinoma in situ (DCIS) for which she underwent surgery and completed radiation treatment. She is currently being evaluated for endocrine therapy options. Initially, she was on Nexplanon, a birth control implant, which was removed prior to her current treatment plan. She had an endometrial ablation in 2022.  Her hormone levels were checked, revealing an estradiol  level of 55. Despite having undergone uterine ablation and not experiencing menstrual cycles, her ovaries are still functional. She is currently on tamoxifen , which is suitable for both premenopausal and postmenopausal women.  She received a notification from [facility] indicating she was due for a mammogram, but there was confusion as she recently had a lumpectomy. She was informed she would be due for a mammogram in March or April. She last saw her surgeon in July and was advised to follow up in a year.  Her bone density is normal, with no signs of osteopenia or osteoporosis.       ECOG PS- 0 Pain scale- 0   Review of systems- Review of Systems  Constitutional:  Negative for chills, fever, malaise/fatigue and weight loss.  HENT:  Negative for congestion, ear discharge and nosebleeds.   Eyes:  Negative for blurred vision.  Respiratory:  Negative for cough, hemoptysis, sputum production, shortness of breath and wheezing.   Cardiovascular:  Negative for chest pain, palpitations, orthopnea and claudication.  Gastrointestinal:  Negative for abdominal pain, blood in stool, constipation, diarrhea, heartburn, melena, nausea and vomiting.  Genitourinary:  Negative for dysuria, flank pain, frequency, hematuria and urgency.  Musculoskeletal:  Negative for back pain, joint pain and myalgias.  Skin:  Negative for rash.  Neurological:  Negative for dizziness, tingling, focal  weakness, seizures, weakness and headaches.  Endo/Heme/Allergies:  Does not bruise/bleed easily.  Psychiatric/Behavioral:  Negative for depression and suicidal  ideas. The patient does not have insomnia.       No Known Allergies   Past Medical History:  Diagnosis Date   Ankle pain    Anxiety    Cancer (HCC) 2025   Left Breast Cancer   Hypertension      Past Surgical History:  Procedure Laterality Date   ABLATION     uterus   ACHILLES TENDON REPAIR Right 2016   ACHILLES TENDON REPAIR Right 2017   BREAST BIOPSY Left 10/23/2023   Stereo bx, X-clip posterior, path pending   BREAST BIOPSY Left 10/23/2023   Stereo bx, coil clip anterior, path pending   BREAST BIOPSY Left 10/23/2023   MM LT BREAST BX W LOC DEV 1ST LESION IMAGE BX SPEC STEREO GUIDE 10/23/2023 [facility]   BREAST BIOPSY Left 10/23/2023   MM LT BREAST BX W LOC DEV EA AD LESION IMG BX SPEC STEREO GUIDE 10/23/2023 [facility]   BREAST BIOPSY Left 11/07/2023   MM LT BREAST BX W LOC DEV 1ST LESION IMAGE BX SPEC STEREO GUIDE 11/07/2023 [facility]   BREAST BIOPSY  01/09/2024   MM LT RADIOACTIVE SEED LOC MAMMO GUIDE 01/09/2024 [facility]   BREAST BIOPSY  01/09/2024   MM LT RADIOACTIVE SEED EA ADD LESION LOC MAMMO GUIDE 01/09/2024 [facility]   BREAST BIOPSY  01/09/2024   MM LT RADIOACTIVE SEED EA ADD LESION LOC MAMMO GUIDE 01/09/2024 [facility]   BREAST LUMPECTOMY WITH RADIOACTIVE SEED LOCALIZATION Left 01/11/2024   Procedure: BREAST LUMPECTOMY WITH RADIOACTIVE SEED LOCALIZATION;  Surgeon: Ebbie Cough, MD;  Location: [facility] OR;  Service: General;  Laterality: Left;  LEFT BREAST SEED BRACKETED LUMPECTOMY   COLONOSCOPY WITH PROPOFOL  N/A 06/11/2021   Procedure: COLONOSCOPY WITH PROPOFOL ;  Surgeon: Janalyn Keene NOVAK, MD;  Location: [facility];  Service: Endoscopy;  Laterality: N/A;   TONSILLECTOMY     As a child    Social History   Socioeconomic History   Marital status: Married    Spouse name: Not on file   Number of children: Not on file   Years of education: Not on file   Highest education level: Bachelor's degree (e.g., BA, AB, BS)   Occupational History   Not on file  Tobacco Use   Smoking status: Never   Smokeless tobacco: Never  Vaping Use   Vaping status: Never Used  Substance and Sexual Activity   Alcohol use: Yes    Comment: socially   Drug use: Never   Sexual activity: Yes    Partners: Male    Birth control/protection: Implant  Other Topics Concern   Not on file  Social History Narrative   Patient lives with husband. Recently relocated to [facility] from [place], and prior to that lived in [place] . Patient is from [place] and her family continues to reside there.    Social Drivers of Corporate Investment Banker Strain: Low Risk  (09/13/2023)   Overall Financial Resource Strain (CARDIA)    Difficulty of Paying Living Expenses: Not hard at all  Food Insecurity: No Food Insecurity (10/27/2023)   Hunger Vital Sign    Worried About Running Out of Food in the Last Year: Never true    Ran Out of Food in the Last Year: Never true  Transportation Needs: No Transportation Needs (10/27/2023)   PRAPARE - Administrator, Civil Service (Medical): No    Lack  of Transportation (Non-Medical): No  Physical Activity: Sufficiently Active (09/13/2023)   Exercise Vital Sign    Days of Exercise per Week: 3 days    Minutes of Exercise per Session: 60 min  Stress: Stress Concern Present (09/13/2023)   [employer] of Occupational Health - Occupational Stress Questionnaire    Feeling of Stress : To some extent  Social Connections: Unknown (09/13/2023)   Social Connection and Isolation Panel    Frequency of Communication with Friends and Family: Twice a week    Frequency of Social Gatherings with Friends and Family: Patient declined    Attends Religious Services: Never    Database Administrator or Organizations: Yes    Attends Engineer, Structural: More than 4 times per year    Marital Status: Married  Catering Manager Violence: Not At Risk (10/27/2023)   Humiliation, Afraid, Rape, and Kick questionnaire    Fear of  Current or Ex-Partner: No    Emotionally Abused: No    Physically Abused: No    Sexually Abused: No    Family History  Problem Relation Age of Onset   Diabetes Mother    Thyroid disease Mother    Colon cancer Father 27   Diabetes Father    Heart disease Father    Diabetes Maternal Grandmother    Lung cancer Maternal Grandfather    Breast cancer Neg Hx      Current Outpatient Medications:    gabapentin  (NEURONTIN ) 300 MG capsule, Take 1 tab 3x daily, Disp: 270 capsule, Rfl: 1   LORazepam  (ATIVAN ) 0.5 MG tablet, Take 1 tablet (0.5 mg total) by mouth 2 (two) times daily as needed for anxiety., Disp: 30 tablet, Rfl: 0   traMADol  (ULTRAM ) 50 MG tablet, Take 2 tablets (100 mg total) by mouth every 6 (six) hours as needed., Disp: 40 tablet, Rfl: 0   valsartan  (DIOVAN ) 40 MG tablet, Take 1 tablet (40 mg total) by mouth daily., Disp: 90 tablet, Rfl: 1   clobetasol  cream (TEMOVATE ) 0.05 %, Apply 1 Application topically 2 (two) times daily., Disp: 30 g, Rfl: 0   tamoxifen  (NOLVADEX ) 20 MG tablet, Take 1 tablet (20 mg total) by mouth daily., Disp: 90 tablet, Rfl: 1  Physical exam:  Vitals:   06/07/24 0949  BP: 127/75  Pulse: 79  Resp: 19  Temp: 97.8 F (36.6 C)  TempSrc: Tympanic  SpO2: 97%  Weight: 233 lb 14.4 oz (106.1 kg)  Height: 5' 7 (1.702 m)   Physical Exam Cardiovascular:     Rate and Rhythm: Normal rate and regular rhythm.     Heart sounds: Normal heart sounds.  Pulmonary:     Effort: Pulmonary effort is normal.     Breath sounds: Normal breath sounds.  Skin:    General: Skin is warm and dry.  Neurological:     Mental Status: She is alert and oriented to person, place, and time.    Breast exam was performed in seated and lying down position. Patient is status post left lumpectomy with a well-healed surgical scar. No evidence of any palpable masses. No evidence of axillary adenopathy. No evidence of any palpable masses or lumps in the right breast. No evidence of  right axillary adenopathy   I have personally reviewed labs listed below:    Latest Ref Rng & Units 06/07/2024    9:07 AM  CMP  Glucose 70 - 99 mg/dL 898   BUN 6 - 20 mg/dL 18   Creatinine 9.55 - 1.00  mg/dL 9.20   Sodium 864 - 854 mmol/L 139   Potassium 3.5 - 5.1 mmol/L 3.8   Chloride 98 - 111 mmol/L 106   CO2 22 - 32 mmol/L 26   Calcium 8.9 - 10.3 mg/dL 8.7   Total Protein 6.5 - 8.1 g/dL 6.9   Total Bilirubin 0.0 - 1.2 mg/dL 0.3   Alkaline Phos 38 - 126 U/L 13   AST 15 - 41 U/L 17   ALT 0 - 44 U/L 13       Latest Ref Rng & Units 03/14/2024    8:25 AM  CBC  WBC 4.0 - 10.5 K/uL 6.4   Hemoglobin 12.0 - 15.0 g/dL 86.6   Hematocrit 63.9 - 46.0 % 39.8   Platelets 150 - 400 K/uL 245    I have personally reviewed Radiology images listed below: No images are attached to the encounter.  DG Bone Density Result Date: 05/30/2024 EXAM: DUAL X-RAY ABSORPTIOMETRY (DXA) FOR BONE MINERAL DENSITY 05/30/2024 8:41 am CLINICAL DATA:  48 year old Female Postmenopausal. DCIS; Screening for osteoporosis History of fragility fracture. TECHNIQUE: An axial (e.g., hips, spine) and/or appendicular (e.g., radius) exam was performed, as appropriate, using GE Secretary/administrator at [facility]. Images are obtained for bone mineral density measurement and are not obtained for diagnostic purposes. [id number] Exclusions: None. COMPARISON:  None. FINDINGS: Scan quality: Good. LUMBAR SPINE (L1-L4): BMD (in g/cm2): 1.414 T-score: 1.8 Z-score: 2.2 LEFT FEMORAL NECK: BMD (in g/cm2): 0.977 T-score: -0.4 Z-score: 0.4 LEFT TOTAL HIP: BMD (in g/cm2): 1.054 T-score: 0.4 Z-score: 0.8 RIGHT FEMORAL NECK: BMD (in g/cm2): 0.991 T-score: -0.3 Z-score: 0.5 RIGHT TOTAL HIP: BMD (in g/cm2): 1.066 T-score: 0.5 Z-score: 0.9 LEFT FOREARM (RADIUS 33%): BMD (in g/cm2): 1.049 T-score: 2.0 Z-score: 2.0 FRAX 10-YEAR PROBABILITY OF FRACTURE: FRAX not reported as the lowest BMD is not in the osteopenia range. IMPRESSION:  Normal based on BMD. Fracture risk is increased. Increased risk is based on history of fragility fracture. RECOMMENDATIONS: 1. All patients should optimize calcium and vitamin D intake. 2. Consider FDA-approved medical therapies in postmenopausal women and men aged 36 years and older, based on the following: - A hip or vertebral (clinical or morphometric) fracture - T-score less than or equal to -2.5 and secondary causes have been excluded. - Low bone mass (T-score between -1.0 and -2.5) and a 10-year probability of a hip fracture greater than or equal to 3% or a 10-year probability of a major osteoporosis-related fracture greater than or equal to 20% based on the [place] -adapted WHO algorithm. - Clinician judgment and/or patient preferences may indicate treatment for people with 10-year fracture probabilities above or below these levels 3. Patients with diagnosis of osteoporosis or at high risk for fracture should have regular bone mineral density tests. For patients eligible for Medicare, routine testing is allowed once every 2 years. The testing frequency can be increased to one year for patients who have rapidly progressing disease, those who are receiving or discontinuing medical therapy to restore bone mass, or have additional risk factors. Electronically Signed   By: Alm Parkins M.D.   On: 05/30/2024 11:07     Assessment and plan- Patient is a 48 y.o. 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 Estradiol  levels indicate non-postmenopausal status. Tamoxifen  used for endocrine  therapy; aromatase inhibitors considered if postmenopausal. Tamoxifen  has slightly inferior risk reduction for DCIS compared to aromatase inhibitors in younger women, but overall survival is the same.  Tamoxifen  carries a small risk of blood clots; aromatase inhibitors can affect bone health. Bone density is normal. - Continue tamoxifen  therapy. - Check hormone status in six months to assess postmenopausal status. - Schedule mammogram for March 2026 in [place]. - Monitor bone density every other year.  Normal bone density, ongoing surveillance Bone density is normal with no osteopenia or osteoporosis. Surveillance planned to monitor bone health, especially if aromatase inhibitor therapy is initiated. - Continue to monitor bone density every other year.         Visit Diagnosis 1. Ductal carcinoma in situ (DCIS) of left breast   2. Encounter for monitoring tamoxifen  therapy      Dr. Annah Skene, MD, MPH [facility] [phone] 06/07/2024 12:35 PM

## 2024-06-10 ENCOUNTER — Other Ambulatory Visit: Payer: Self-pay | Admitting: Oncology

## 2024-06-10 DIAGNOSIS — D0512 Intraductal carcinoma in situ of left breast: Secondary | ICD-10-CM

## 2024-06-11 ENCOUNTER — Encounter: Payer: Self-pay | Admitting: Oncology

## 2024-06-11 ENCOUNTER — Encounter: Payer: Self-pay | Admitting: Nurse Practitioner

## 2024-06-12 ENCOUNTER — Telehealth (INDEPENDENT_AMBULATORY_CARE_PROVIDER_SITE_OTHER): Admitting: Nurse Practitioner

## 2024-06-12 VITALS — Ht 67.01 in

## 2024-06-12 DIAGNOSIS — Z538 Procedure and treatment not carried out for other reasons: Secondary | ICD-10-CM

## 2024-06-12 NOTE — Progress Notes (Signed)
 Patient cancelled appt due to not being able to login to video.

## 2024-06-25 ENCOUNTER — Encounter: Payer: Self-pay | Admitting: Nurse Practitioner

## 2024-06-25 ENCOUNTER — Ambulatory Visit (INDEPENDENT_AMBULATORY_CARE_PROVIDER_SITE_OTHER): Payer: Self-pay | Admitting: Nurse Practitioner

## 2024-06-25 VITALS — BP 131/76 | HR 74 | Temp 98.1°F | Ht 67.01 in | Wt 238.2 lb

## 2024-06-25 DIAGNOSIS — G8929 Other chronic pain: Secondary | ICD-10-CM

## 2024-06-25 DIAGNOSIS — M25571 Pain in right ankle and joints of right foot: Secondary | ICD-10-CM

## 2024-06-25 DIAGNOSIS — E785 Hyperlipidemia, unspecified: Secondary | ICD-10-CM

## 2024-06-25 DIAGNOSIS — F419 Anxiety disorder, unspecified: Secondary | ICD-10-CM | POA: Diagnosis not present

## 2024-06-25 DIAGNOSIS — I10 Essential (primary) hypertension: Secondary | ICD-10-CM

## 2024-06-25 DIAGNOSIS — Z1211 Encounter for screening for malignant neoplasm of colon: Secondary | ICD-10-CM

## 2024-06-25 MED ORDER — VALSARTAN 40 MG PO TABS: 40.0000 mg | ORAL_TABLET | Freq: Every day | ORAL | 1 refills | Status: AC

## 2024-06-25 MED ORDER — GABAPENTIN 300 MG PO CAPS: ORAL_CAPSULE | ORAL | 1 refills | Status: AC

## 2024-06-25 NOTE — Assessment & Plan Note (Signed)
 Ongoing problem.  Improved with Gabapentin .  Uses tramadol  rarely for leg pain.  No refills needed at visit.

## 2024-06-25 NOTE — Assessment & Plan Note (Signed)
 Chronic.  Controlled.  Continue with current medication regimen of Valsartan  40mg .   Recommend checking blood pressures regularly at home and bringing log to next visit.  Refills sent today.  Labs ordered today.  Return to clinic in 6 months for reevaluation.  Call sooner if concerns arise.

## 2024-06-25 NOTE — Assessment & Plan Note (Signed)
 Labs ordered at visit today.  Will make recommendations based on lab results.

## 2024-06-25 NOTE — Assessment & Plan Note (Signed)
 Chronic.  Controlled.  Continue with current medication regimen of PRN Lorazepam .  Aware of risks of Benzodiazepine.  Last filled in February 2025 and still has some on hand. Labs ordered today.  Return to clinic in 6 months for reevaluation.  Call sooner if concerns arise.

## 2024-06-25 NOTE — Progress Notes (Signed)
 BP 131/76 (BP Location: Right Arm, Patient Position: Sitting, Cuff Size: Large)   Pulse 74   Temp 98.1 F (36.7 C) (Oral)   Ht 5' 7.01 (1.702 m)   Wt 238 lb 3.2 oz (108 kg)   SpO2 96%   BMI 37.30 kg/m    Subjective:    Patient ID: Darlene [name], female    DOB: 09/18/1975, 48 y.o.   MRN: [id number]  HPI: Darlene [name] is a 48 y.o. female  Chief Complaint  Patient presents with   office visit    6 month F/u.    HYPERTENSION Hypertension status: controlled Satisfied with current treatment? no Duration of hypertension: years BP monitoring frequency:  sometimes BP range: 130/80 BP medication side effects:  no Medication compliance: poor compliance Previous BP meds: Valsartan   Aspirin: no Recurrent headaches: no Visual changes: no Palpitations: no Dyspnea: no Chest pain: no Lower extremity edema: no Dizzy/lightheaded: no  BREAST CANCER Patient is taking Tamoxifen .  She is doing okay with the medication.  She does have a lot of ovary and bladder pain.  The pain did go away.  She was seen by GYN which was unremarkable.  She did have a cyst on her ovary.   ANXIETY Patient states she has been okay.  She does occasionally use ativan .  No panic attacks.  Just using it occasionally with social situations.  She is worried about the cancer.  Denies SI.  LEG PAIN Still using Gabapentin  works for her restless leg.  She does use Tramadol  PRN for leg pain.  However, she did use more recently after her Lumpectomy.     Relevant past medical, surgical, family and social history reviewed and updated as indicated. Interim medical history since our last visit reviewed. Allergies and medications reviewed and updated.  Review of Systems  Eyes:  Negative for visual disturbance.  Respiratory:  Negative for cough, chest tightness and shortness of breath.   Cardiovascular:  Negative for chest pain, palpitations and leg swelling.  Genitourinary:  Positive for pelvic pain.   Musculoskeletal:        Left leg pain  Neurological:  Negative for dizziness and headaches.  Psychiatric/Behavioral:  The patient is nervous/anxious.     Per HPI unless specifically indicated above     Objective:    BP 131/76 (BP Location: Right Arm, Patient Position: Sitting, Cuff Size: Large)   Pulse 74   Temp 98.1 F (36.7 C) (Oral)   Ht 5' 7.01 (1.702 m)   Wt 238 lb 3.2 oz (108 kg)   SpO2 96%   BMI 37.30 kg/m   Wt Readings from Last 3 Encounters:  06/25/24 238 lb 3.2 oz (108 kg)  06/07/24 233 lb 14.4 oz (106.1 kg)  01/24/24 233 lb 14.4 oz (106.1 kg)    Physical Exam Vitals and nursing note reviewed.  Constitutional:      General: She is not in acute distress.    Appearance: Normal appearance. She is normal weight. She is not ill-appearing, toxic-appearing or diaphoretic.  HENT:     Head: Normocephalic.     Right Ear: External ear normal.     Left Ear: External ear normal.     Nose: Nose normal.     Mouth/Throat:     Mouth: Mucous membranes are moist.     Pharynx: Oropharynx is clear.  Eyes:     General:        Right eye: No discharge.        Left eye: No  discharge.     Extraocular Movements: Extraocular movements intact.     Conjunctiva/sclera: Conjunctivae normal.     Pupils: Pupils are equal, round, and reactive to light.  Cardiovascular:     Rate and Rhythm: Normal rate and regular rhythm.     Heart sounds: No murmur heard. Pulmonary:     Effort: Pulmonary effort is normal. No respiratory distress.     Breath sounds: Normal breath sounds. No wheezing or rales.  Musculoskeletal:     Cervical back: Normal range of motion and neck supple.  Skin:    General: Skin is warm and dry.     Capillary Refill: Capillary refill takes less than 2 seconds.  Neurological:     General: No focal deficit present.     Mental Status: She is alert and oriented to person, place, and time. Mental status is at baseline.  Psychiatric:        Mood and Affect: Mood normal.         Behavior: Behavior normal.        Thought Content: Thought content normal.        Judgment: Judgment normal.     Results for orders placed or performed in visit on 06/07/24  CMP (Cancer Center only)   Collection Time: 06/07/24  9:07 AM  Result Value Ref Range   Sodium 139 135 - 145 mmol/L   Potassium 3.8 3.5 - 5.1 mmol/L   Chloride 106 98 - 111 mmol/L   CO2 26 22 - 32 mmol/L   Glucose, Bld 101 (H) 70 - 99 mg/dL   BUN 18 6 - 20 mg/dL   Creatinine 9.20 9.55 - 1.00 mg/dL   Calcium 8.7 (L) 8.9 - 10.3 mg/dL   Total Protein 6.9 6.5 - 8.1 g/dL   Albumin 3.5 3.5 - 5.0 g/dL   AST 17 15 - 41 U/L   ALT 13 0 - 44 U/L   Alkaline Phosphatase 13 (L) 38 - 126 U/L   Total Bilirubin 0.3 0.0 - 1.2 mg/dL   GFR, Estimated >39 >39 mL/min   Anion gap 7 5 - 15      Assessment & Plan:   Problem List Items Addressed This Visit       Cardiovascular and Mediastinum   Primary hypertension - Primary   Chronic.  Controlled.  Continue with current medication regimen of Valsartan  40mg .   Recommend checking blood pressures regularly at home and bringing log to next visit.  Refills sent today.  Labs ordered today.  Return to clinic in 6 months for reevaluation.  Call sooner if concerns arise.       Relevant Medications   valsartan  (DIOVAN ) 40 MG tablet   Other Relevant Orders   Comprehensive metabolic panel with GFR     Other   Chronic pain of right ankle   Ongoing problem.  Improved with Gabapentin .  Uses tramadol  rarely for leg pain.  No refills needed at visit.      Relevant Medications   gabapentin  (NEURONTIN ) 300 MG capsule   Anxiety   Chronic.  Controlled.  Continue with current medication regimen of PRN Lorazepam .  Aware of risks of Benzodiazepine.  Last filled in February 2025 and still has some on hand. Labs ordered today.  Return to clinic in 6 months for reevaluation.  Call sooner if concerns arise.       Hyperlipidemia   Labs ordered at visit today.  Will make recommendations  based on lab results.  Relevant Medications   valsartan  (DIOVAN ) 40 MG tablet   Other Relevant Orders   Lipid panel   Other Visit Diagnoses       Screening for colon cancer       Relevant Orders   Ambulatory referral to Gastroenterology        Follow up plan: Return in about 6 months (around 12/24/2024) for Physical and Fasting labs.

## 2024-06-26 ENCOUNTER — Ambulatory Visit: Payer: Self-pay | Admitting: Nurse Practitioner

## 2024-06-26 LAB — LIPID PANEL
Chol/HDL Ratio: 4.1 ratio (ref 0.0–4.4)
Cholesterol, Total: 199 mg/dL (ref 100–199)
HDL: 49 mg/dL (ref >39)
LDL Chol Calc (NIH): 132 mg/dL — ABNORMAL HIGH (ref 0–99)
Triglycerides: 101 mg/dL (ref 0–149)
VLDL Cholesterol Cal: 18 mg/dL (ref 5–40)

## 2024-06-26 LAB — COMPREHENSIVE METABOLIC PANEL WITH GFR
ALT: 13 IU/L (ref 0–32)
AST: 17 IU/L (ref 0–40)
Albumin: 4.1 g/dL (ref 3.9–4.9)
Alkaline Phosphatase: 15 IU/L — ABNORMAL LOW (ref 41–116)
BUN/Creatinine Ratio: 19 (ref 9–23)
BUN: 14 mg/dL (ref 6–24)
Bilirubin Total: 0.4 mg/dL (ref 0.0–1.2)
CO2: 23 mmol/L (ref 20–29)
Calcium: 9.3 mg/dL (ref 8.7–10.2)
Chloride: 103 mmol/L (ref 96–106)
Creatinine, Ser: 0.75 mg/dL (ref 0.57–1.00)
Globulin, Total: 2.8 g/dL (ref 1.5–4.5)
Glucose: 92 mg/dL (ref 70–99)
Potassium: 4.3 mmol/L (ref 3.5–5.2)
Sodium: 141 mmol/L (ref 134–144)
Total Protein: 6.9 g/dL (ref 6.0–8.5)
eGFR: 97 mL/min/1.73 (ref >59)

## 2024-07-03 ENCOUNTER — Other Ambulatory Visit: Payer: Self-pay

## 2024-07-03 ENCOUNTER — Telehealth: Payer: Self-pay

## 2024-07-03 DIAGNOSIS — Z800 Family history of malignant neoplasm of digestive organs: Secondary | ICD-10-CM

## 2024-07-03 DIAGNOSIS — Z1211 Encounter for screening for malignant neoplasm of colon: Secondary | ICD-10-CM

## 2024-07-03 MED ORDER — NA SULFATE-K SULFATE-MG SULF 17.5-3.13-1.6 GM/177ML PO SOLN: 1.0000 | Freq: Once | ORAL | 0 refills | Status: AC

## 2024-07-03 NOTE — Telephone Encounter (Signed)
 Gastroenterology Pre-Procedure Review  Request Date: 10/18/24 Requesting Physician: Dr. Melany  PATIENT REVIEW QUESTIONS: The patient responded to the following health history questions as indicated:    1. Are you having any GI issues? no 2. Do you have a personal history of Polyps? yes (benign polyps 06/11/21 performed by Dr. Janalyn) 3. Do you have a family history of Colon Cancer or Polyps? yes (father colon cancer) 4. Diabetes Mellitus? no 5. Joint replacements in the past 12 months?Lumpectomy in June 2025 6. Major health problems in the past 3 months?no 7. Any artificial heart valves, MVP, or defibrillator?no    MEDICATIONS & ALLERGIES:    Patient reports the following regarding taking any anticoagulation/antiplatelet therapy:   Plavix, Coumadin, Eliquis, Xarelto, Lovenox, Pradaxa, Brilinta, or Effient? no Aspirin? no  Patient confirms/reports the following medications:  Current Outpatient Medications  Medication Sig Dispense Refill   gabapentin  (NEURONTIN ) 300 MG capsule Take 1 tab 3x daily 270 capsule 1   LORazepam  (ATIVAN ) 0.5 MG tablet Take 1 tablet (0.5 mg total) by mouth 2 (two) times daily as needed for anxiety. 30 tablet 0   tamoxifen  (NOLVADEX ) 20 MG tablet Take 1 tablet (20 mg total) by mouth daily. 90 tablet 1   traMADol  (ULTRAM ) 50 MG tablet Take 2 tablets (100 mg total) by mouth every 6 (six) hours as needed. 40 tablet 0   valsartan  (DIOVAN ) 40 MG tablet Take 1 tablet (40 mg total) by mouth daily. 90 tablet 1   No current facility-administered medications for this visit.    Patient confirms/reports the following allergies:  No Known Allergies  No orders of the defined types were placed in this encounter.   AUTHORIZATION INFORMATION Primary Insurance: 1D#: Group #:  Secondary Insurance: 1D#: Group #:  SCHEDULE INFORMATION: Date: 10/18/24 Time: Location: [facility]

## 2024-08-30 ENCOUNTER — Encounter: Payer: Self-pay | Admitting: Nurse Practitioner

## 2024-10-18 ENCOUNTER — Ambulatory Visit: Admit: 2024-10-18

## 2024-10-18 SURGERY — COLONOSCOPY
Anesthesia: General

## 2024-12-02 ENCOUNTER — Inpatient Hospital Stay: Admitting: Oncology

## 2024-12-02 ENCOUNTER — Inpatient Hospital Stay

## 2024-12-06 ENCOUNTER — Inpatient Hospital Stay: Admitting: Oncology

## 2024-12-06 ENCOUNTER — Inpatient Hospital Stay

## 2024-12-30 ENCOUNTER — Encounter: Admitting: Nurse Practitioner
